# Patient Record
Sex: Male | Born: 1980 | Race: White | Hispanic: No | Marital: Married | Smoking: Former smoker
Health system: Southern US, Community
[De-identification: ages and names within clinical notes are randomized; demographics above are authoritative.]

## PROBLEM LIST (undated history)

## (undated) ENCOUNTER — Ambulatory Visit: Admission: EM | Discharge status: Against Medical Advice | Payer: 59

## (undated) DIAGNOSIS — M5416 Radiculopathy, lumbar region: Secondary | ICD-10-CM

## (undated) DIAGNOSIS — R55 Syncope and collapse: Secondary | ICD-10-CM

## (undated) HISTORY — PX: COLONOSCOPY: SHX174

---

## 2004-06-25 ENCOUNTER — Emergency Department: Payer: Self-pay | Admitting: Emergency Medicine

## 2005-05-12 HISTORY — PX: COLONOSCOPY: SHX174

## 2005-05-12 HISTORY — PX: UPPER GI ENDOSCOPY: SHX6162

## 2006-02-23 ENCOUNTER — Emergency Department: Payer: Self-pay | Admitting: Emergency Medicine

## 2006-08-20 ENCOUNTER — Other Ambulatory Visit: Payer: Self-pay

## 2006-08-20 ENCOUNTER — Emergency Department: Payer: Self-pay | Admitting: Emergency Medicine

## 2008-01-09 ENCOUNTER — Emergency Department: Payer: Self-pay | Admitting: Emergency Medicine

## 2008-10-17 ENCOUNTER — Inpatient Hospital Stay: Payer: Self-pay | Admitting: Internal Medicine

## 2009-05-12 DIAGNOSIS — K2970 Gastritis, unspecified, without bleeding: Secondary | ICD-10-CM

## 2009-05-12 HISTORY — DX: Gastritis, unspecified, without bleeding: K29.70

## 2009-12-04 ENCOUNTER — Ambulatory Visit: Payer: Self-pay | Admitting: Gastroenterology

## 2010-03-01 ENCOUNTER — Ambulatory Visit: Payer: Self-pay | Admitting: General Practice

## 2011-03-06 ENCOUNTER — Ambulatory Visit: Payer: Self-pay | Admitting: General Practice

## 2011-04-27 ENCOUNTER — Emergency Department: Payer: Self-pay | Admitting: General Practice

## 2012-05-14 ENCOUNTER — Ambulatory Visit: Payer: Self-pay | Admitting: Family Medicine

## 2012-05-17 ENCOUNTER — Ambulatory Visit: Payer: Self-pay | Admitting: General Practice

## 2012-09-06 ENCOUNTER — Emergency Department: Payer: Self-pay | Admitting: Emergency Medicine

## 2015-08-09 ENCOUNTER — Ambulatory Visit
Admission: RE | Admit: 2015-08-09 | Discharge: 2015-08-09 | Disposition: A | Discharge status: Home / Self Care | Payer: Worker's Compensation | Source: Ambulatory Visit | Attending: Family Medicine | Admitting: Family Medicine

## 2015-08-09 ENCOUNTER — Other Ambulatory Visit: Payer: Self-pay | Admitting: Family Medicine

## 2015-08-09 DIAGNOSIS — M47896 Other spondylosis, lumbar region: Secondary | ICD-10-CM | POA: Insufficient documentation

## 2015-08-09 DIAGNOSIS — M4802 Spinal stenosis, cervical region: Secondary | ICD-10-CM | POA: Diagnosis not present

## 2015-08-09 DIAGNOSIS — M542 Cervicalgia: Secondary | ICD-10-CM

## 2015-08-16 ENCOUNTER — Encounter: Payer: Self-pay | Admitting: General Surgery

## 2015-08-21 ENCOUNTER — Encounter: Payer: Self-pay | Admitting: General Surgery

## 2015-08-21 ENCOUNTER — Ambulatory Visit (INDEPENDENT_AMBULATORY_CARE_PROVIDER_SITE_OTHER): Payer: BLUE CROSS/BLUE SHIELD | Admitting: General Surgery

## 2015-08-21 VITALS — BP 138/72 | HR 74 | Resp 12 | Ht 74.0 in | Wt 243.0 lb

## 2015-08-21 DIAGNOSIS — M795 Residual foreign body in soft tissue: Secondary | ICD-10-CM | POA: Diagnosis not present

## 2015-08-21 NOTE — Patient Instructions (Signed)
Call with any questions or concerns. Review options and let us know.
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
## 2015-08-27 ENCOUNTER — Other Ambulatory Visit (HOSPITAL_COMMUNITY): Payer: Self-pay | Admitting: Family Medicine

## 2015-08-28 ENCOUNTER — Other Ambulatory Visit: Payer: Self-pay | Admitting: Family Medicine

## 2015-08-28 DIAGNOSIS — M5410 Radiculopathy, site unspecified: Secondary | ICD-10-CM

## 2015-08-28 DIAGNOSIS — M542 Cervicalgia: Secondary | ICD-10-CM

## 2015-09-14 ENCOUNTER — Ambulatory Visit
Admission: RE | Admit: 2015-09-14 | Discharge: 2015-09-14 | Disposition: A | Discharge status: Home / Self Care | Payer: Worker's Compensation | Source: Ambulatory Visit | Attending: Family Medicine | Admitting: Family Medicine

## 2015-09-14 DIAGNOSIS — M50222 Other cervical disc displacement at C5-C6 level: Secondary | ICD-10-CM | POA: Insufficient documentation

## 2015-09-14 DIAGNOSIS — M4802 Spinal stenosis, cervical region: Secondary | ICD-10-CM | POA: Diagnosis not present

## 2015-09-14 DIAGNOSIS — M47816 Spondylosis without myelopathy or radiculopathy, lumbar region: Secondary | ICD-10-CM | POA: Insufficient documentation

## 2015-09-14 DIAGNOSIS — M542 Cervicalgia: Secondary | ICD-10-CM | POA: Diagnosis present

## 2015-09-14 DIAGNOSIS — M778 Other enthesopathies, not elsewhere classified: Secondary | ICD-10-CM | POA: Diagnosis not present

## 2015-09-14 DIAGNOSIS — M5410 Radiculopathy, site unspecified: Secondary | ICD-10-CM

## 2015-09-14 HISTORY — DX: Syncope and collapse: R55

## 2015-09-14 MED ORDER — IOPAMIDOL (ISOVUE-M 300) INJECTION 61%: 15.0000 mL | Freq: Once | INTRAMUSCULAR | Status: DC | PRN

## 2015-09-14 NOTE — Progress Notes (Signed)
Patient ID: Garrett Mcpherson, male   DOB: [birth year], 35 y.o.   MRN: [id number] Cx myelogram completed.No complication. Pt stable.VSS,puncture site stable.D/C instructions given.F/U with his M.D.

## 2015-09-14 NOTE — Progress Notes (Signed)
Seen by Dr. Rosalene Billingsegister.  Patient cleared to be d/c home at 1330.

## 2016-10-05 IMAGING — CR DG MYELOGRAM CERVICAL
2 series · 11 of 11 positions shown · non-contrast
Comparison: none

CLINICAL DATA: Neck pain.
TECHNIQUE: Contiguous axial images were obtained through the Cervical spine
after the intrathecal infusion of infusion. Coronal and sagittal
reconstructions were obtained of the axial image sets.

[Series 1: cp_standard · 0.17mm/px · 10 of 10 slices shown]
[im 1/10]
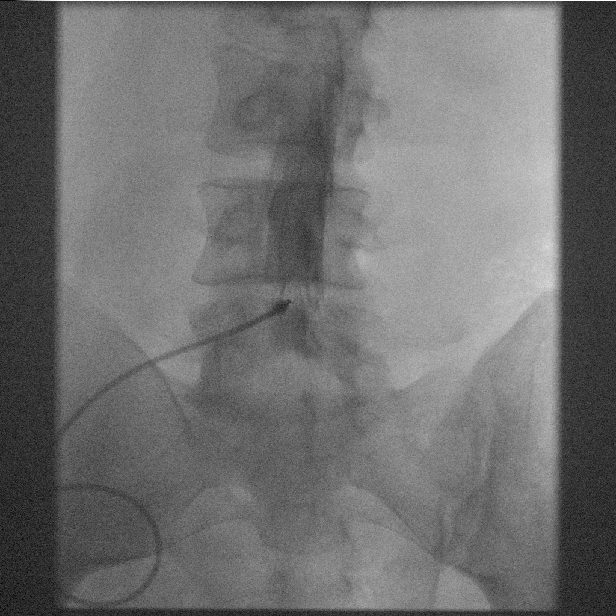
[im 2/10]
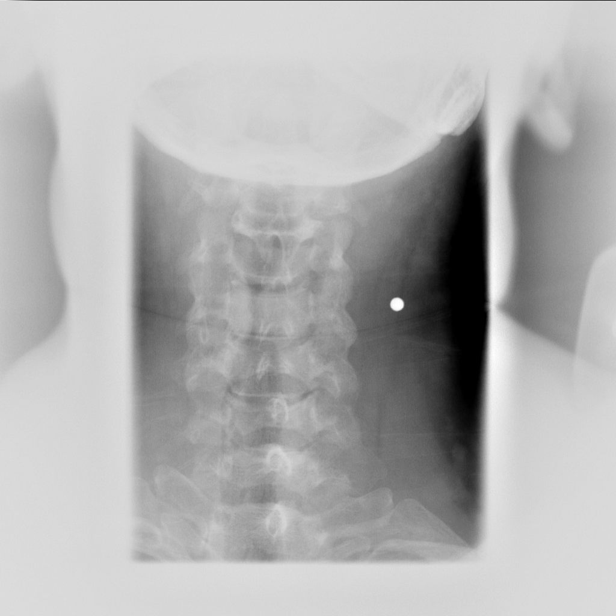
[im 3/10]
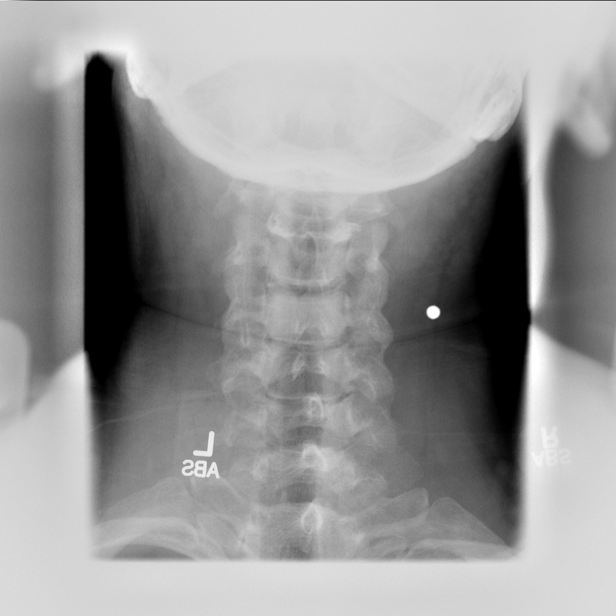
[im 4/10]
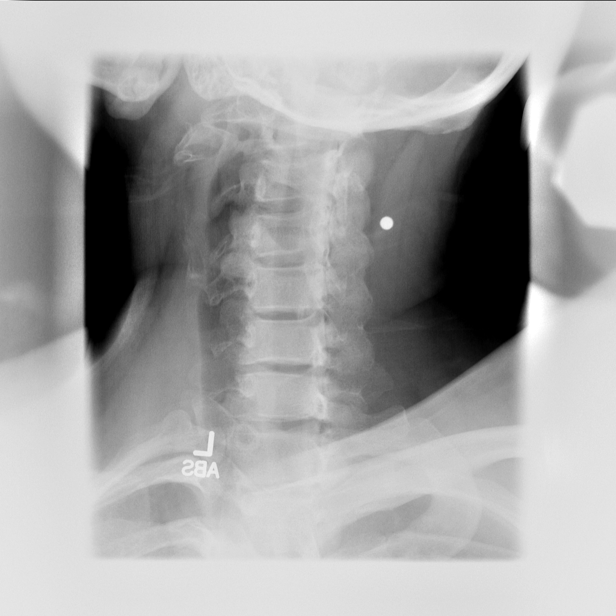
[im 5/10]
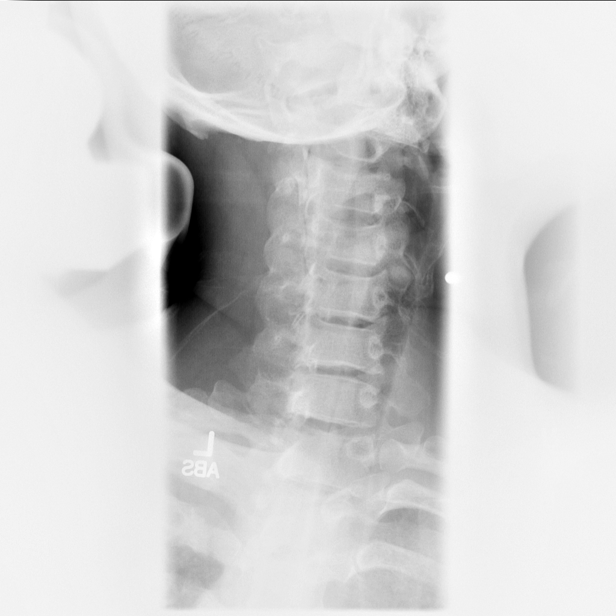
[im 6/10]
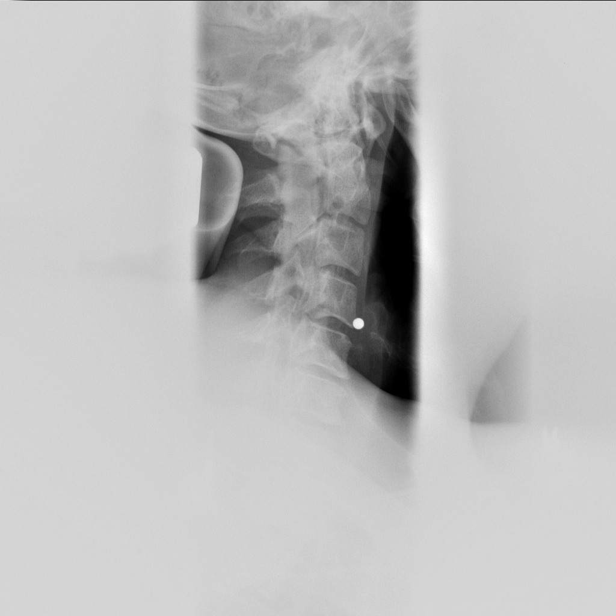
[im 7/10]
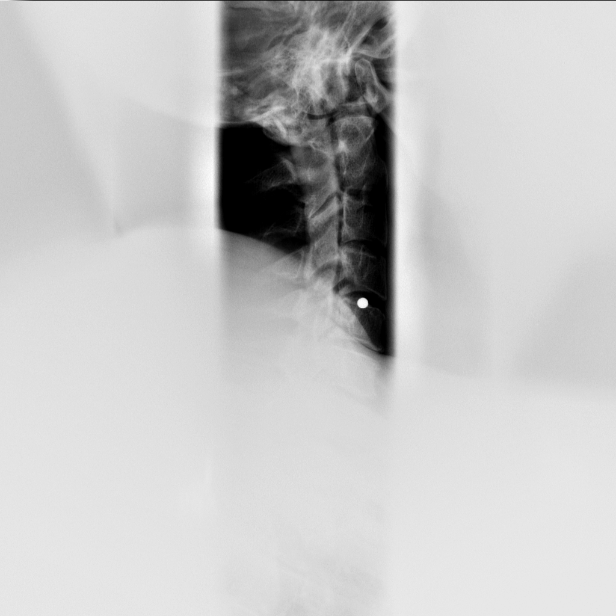
[im 8/10]
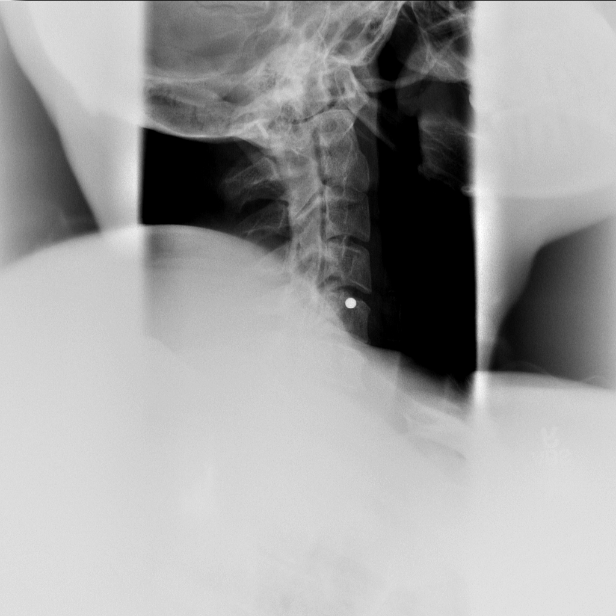
[im 9/10]
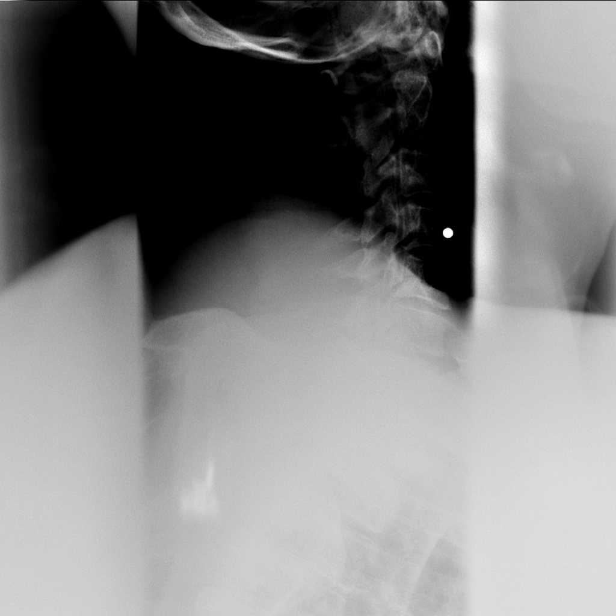
[im 10/10]
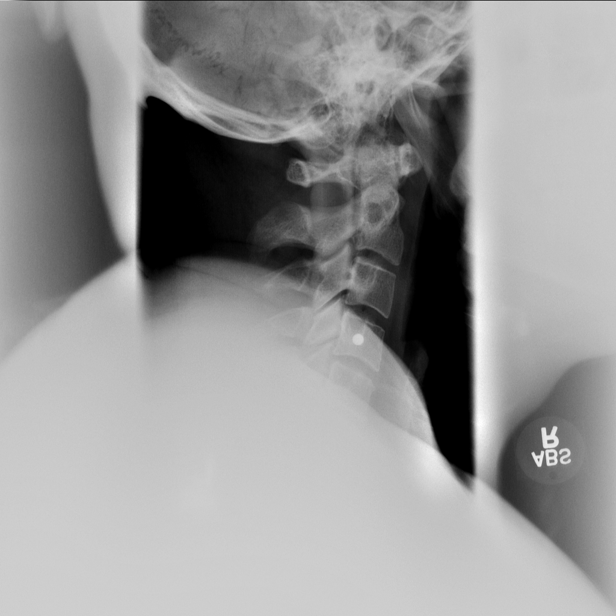

[x cervical spine lat]
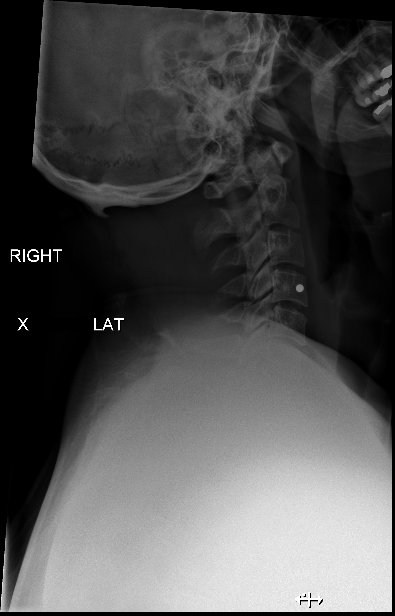

[11 of 11 positions shown; findings below may reference images not displayed]

FLUOROSCOPY TIME:  Fluoroscopic time 6 minutes 24 seconds. Eleven
images obtained .

PROCEDURE:
LUMBAR PUNCTURE FOR CERVICAL MYELOGRAM

After thorough discussion of risks and benefits of the procedure
including bleeding, infection, injury to nerves, blood vessels,
adjacent structures as well as headache and CSF leak, written and
oral informed consent was obtained. Consent was obtained by Dr.
Deeqa Rayaan Adlaho. We discussed the high likelihood of obtaining a
diagnostic study.

Patient was positioned prone on the fluoroscopy table. Local
anesthesia was provided with 1% lidocaine without epinephrine after
prepped and draped in the usual sterile fashion. Puncture was
performed at L4-L5 using a 3 1/2 inch 22-gauge spinal needle via
right _Paramedian) approach. Using a single pass through the dura,
the needle was placed within the thecal sac, with return of clear
CSF. 10 mL of Isovue-M -300 was injected into the thecal sac, with
normal opacification of the nerve roots and cauda equina consistent
with free flow within the subarachnoid space. The patient was then
moved to the trendelenburg position and contrast flowed into the
Cervical spine region.

I personally performed the lumbar puncture and administered the
intrathecal contrast. I also personally supervised acquisition of
the myelogram images.
FINDINGS: CERVICAL MYELOGRAM FINDINGS:

Contrast is noted in the cervical canal. No high-grade cervical
stenosis noted. Mild degenerative changes lower lumbar spine.
Metallic foreign body noted over the right neck.
IMPRESSION: Successful cervical myelogram. No complications. Reference is made
to postmyelogram CT for further discussion of findings.

## 2017-05-12 DIAGNOSIS — K37 Unspecified appendicitis: Secondary | ICD-10-CM

## 2017-05-12 HISTORY — DX: Unspecified appendicitis: K37

## 2017-12-20 ENCOUNTER — Encounter
Admission: EM | Disposition: A | Discharge status: Home / Self Care | Payer: Self-pay | Source: Home / Self Care | Attending: Emergency Medicine

## 2017-12-20 ENCOUNTER — Encounter: Payer: Self-pay | Admitting: Emergency Medicine

## 2017-12-20 ENCOUNTER — Other Ambulatory Visit: Payer: Self-pay

## 2017-12-20 ENCOUNTER — Observation Stay
Admission: EM | Admit: 2017-12-20 | Discharge: 2017-12-22 | Disposition: A | Discharge status: Home / Self Care | Payer: BLUE CROSS/BLUE SHIELD | Attending: Surgery | Admitting: Surgery

## 2017-12-20 ENCOUNTER — Observation Stay: Payer: BLUE CROSS/BLUE SHIELD | Admitting: Anesthesiology

## 2017-12-20 ENCOUNTER — Emergency Department: Payer: BLUE CROSS/BLUE SHIELD

## 2017-12-20 DIAGNOSIS — K3580 Unspecified acute appendicitis: Secondary | ICD-10-CM | POA: Diagnosis present

## 2017-12-20 DIAGNOSIS — Z87891 Personal history of nicotine dependence: Secondary | ICD-10-CM | POA: Diagnosis not present

## 2017-12-20 DIAGNOSIS — K3533 Acute appendicitis with perforation and localized peritonitis, with abscess: Principal | ICD-10-CM | POA: Insufficient documentation

## 2017-12-20 DIAGNOSIS — N289 Disorder of kidney and ureter, unspecified: Secondary | ICD-10-CM | POA: Insufficient documentation

## 2017-12-20 HISTORY — PX: LAPAROSCOPIC APPENDECTOMY: SHX408

## 2017-12-20 LAB — URINALYSIS, COMPLETE (UACMP) WITH MICROSCOPIC
BACTERIA UA: NONE SEEN
Bilirubin Urine: NEGATIVE
Glucose, UA: NEGATIVE mg/dL
Hgb urine dipstick: NEGATIVE
Ketones, ur: NEGATIVE mg/dL
Leukocytes, UA: NEGATIVE
Nitrite: NEGATIVE
PROTEIN: 30 mg/dL — AB
SPECIFIC GRAVITY, URINE: 1.024 (ref 1.005–1.030)
Squamous Epithelial / LPF: NONE SEEN (ref 0–5)
pH: 7 (ref 5.0–8.0)

## 2017-12-20 LAB — COMPREHENSIVE METABOLIC PANEL
ALBUMIN: 4.2 g/dL (ref 3.5–5.0)
ALK PHOS: 63 U/L (ref 38–126)
ALT: 25 U/L (ref 0–44)
ANION GAP: 8 (ref 5–15)
AST: 22 U/L (ref 15–41)
BUN: 15 mg/dL (ref 6–20)
CHLORIDE: 101 mmol/L (ref 98–111)
CO2: 30 mmol/L (ref 22–32)
Calcium: 8.7 mg/dL — ABNORMAL LOW (ref 8.9–10.3)
Creatinine, Ser: 1.26 mg/dL — ABNORMAL HIGH (ref 0.61–1.24)
GFR calc Af Amer: >60 mL/min (ref >60)
GFR calc non Af Amer: >60 mL/min (ref >60)
GLUCOSE: 113 mg/dL — AB (ref 70–99)
Potassium: 4 mmol/L (ref 3.5–5.1)
SODIUM: 139 mmol/L (ref 135–145)
Total Bilirubin: 1.5 mg/dL — ABNORMAL HIGH (ref 0.3–1.2)
Total Protein: 7.2 g/dL (ref 6.5–8.1)

## 2017-12-20 LAB — CBC WITH DIFFERENTIAL/PLATELET
Basophils Absolute: 0.1 10*3/uL (ref 0–0.1)
Basophils Relative: 1 %
EOS PCT: 0 %
Eosinophils Absolute: 0 10*3/uL (ref 0–0.7)
HCT: 48.1 % (ref 40.0–52.0)
Hemoglobin: 16.3 g/dL (ref 13.0–18.0)
LYMPHS ABS: 1.8 10*3/uL (ref 1.0–3.6)
Lymphocytes Relative: 10 %
MCH: 29.4 pg (ref 26.0–34.0)
MCHC: 33.9 g/dL (ref 32.0–36.0)
MCV: 86.9 fL (ref 80.0–100.0)
MONO ABS: 1.7 10*3/uL — AB (ref 0.2–1.0)
MONOS PCT: 10 %
Neutro Abs: 14.2 10*3/uL — ABNORMAL HIGH (ref 1.4–6.5)
Neutrophils Relative %: 79 %
PLATELETS: 173 10*3/uL (ref 150–440)
RBC: 5.53 MIL/uL (ref 4.40–5.90)
RDW: 14.3 % (ref 11.5–14.5)
WBC: 17.9 10*3/uL — ABNORMAL HIGH (ref 3.8–10.6)

## 2017-12-20 LAB — LIPASE, BLOOD: Lipase: 21 U/L (ref 11–51)

## 2017-12-20 LAB — MRSA PCR SCREENING: MRSA by PCR: NEGATIVE

## 2017-12-20 SURGERY — APPENDECTOMY, LAPAROSCOPIC
Anesthesia: General | Wound class: Contaminated

## 2017-12-20 MED ORDER — LIDOCAINE HCL (PF) 1 % IJ SOLN
INTRAMUSCULAR | Status: AC
  Filled 2017-12-20: qty 30

## 2017-12-20 MED ORDER — ROCURONIUM BROMIDE 100 MG/10ML IV SOLN
INTRAVENOUS | Status: DC | PRN
  Administered 2017-12-20: 40 mg via INTRAVENOUS
  Administered 2017-12-20: 10 mg via INTRAVENOUS

## 2017-12-20 MED ORDER — ONDANSETRON 4 MG PO TBDP: 4.0000 mg | ORAL_TABLET | Freq: Four times a day (QID) | ORAL | Status: DC | PRN

## 2017-12-20 MED ORDER — ONDANSETRON HCL 4 MG/2ML IJ SOLN
4.0000 mg | Freq: Four times a day (QID) | INTRAMUSCULAR | Status: DC | PRN
  Administered 2017-12-20: 4 mg via INTRAVENOUS

## 2017-12-20 MED ORDER — LACTATED RINGERS IV SOLN
INTRAVENOUS | Status: DC
  Administered 2017-12-20 – 2017-12-21 (×3): via INTRAVENOUS

## 2017-12-20 MED ORDER — LACTATED RINGERS IV SOLN
INTRAVENOUS | Status: DC
  Administered 2017-12-20 (×2): via INTRAVENOUS

## 2017-12-20 MED ORDER — BUPIVACAINE-EPINEPHRINE 0.5% -1:200000 IJ SOLN
INTRAMUSCULAR | Status: DC | PRN
  Administered 2017-12-20: 7 mL

## 2017-12-20 MED ORDER — SUGAMMADEX SODIUM 500 MG/5ML IV SOLN
INTRAVENOUS | Status: DC | PRN
  Administered 2017-12-20: 217.8 mg via INTRAVENOUS

## 2017-12-20 MED ORDER — FENTANYL CITRATE (PF) 100 MCG/2ML IJ SOLN
INTRAMUSCULAR | Status: DC | PRN
  Administered 2017-12-20: 50 ug via INTRAVENOUS
  Administered 2017-12-20 (×2): 25 ug via INTRAVENOUS
  Administered 2017-12-20 (×2): 50 ug via INTRAVENOUS

## 2017-12-20 MED ORDER — ONDANSETRON HCL 4 MG/2ML IJ SOLN
4.0000 mg | Freq: Once | INTRAMUSCULAR | Status: AC
  Administered 2017-12-20: 4 mg via INTRAVENOUS
  Filled 2017-12-20: qty 2

## 2017-12-20 MED ORDER — MIDAZOLAM HCL 2 MG/2ML IJ SOLN
INTRAMUSCULAR | Status: DC | PRN
Start: 2017-12-20 — End: 2017-12-20
  Administered 2017-12-20: 2 mg via INTRAVENOUS

## 2017-12-20 MED ORDER — HYDROMORPHONE HCL 1 MG/ML IJ SOLN
0.5000 mg | INTRAMUSCULAR | Status: DC | PRN
  Administered 2017-12-20 (×3): 0.5 mg via INTRAVENOUS
  Filled 2017-12-20: qty 1
  Filled 2017-12-20 (×2): qty 0.5

## 2017-12-20 MED ORDER — PROPOFOL 10 MG/ML IV BOLUS
INTRAVENOUS | Status: DC | PRN
  Administered 2017-12-20: 150 mg via INTRAVENOUS

## 2017-12-20 MED ORDER — MORPHINE SULFATE (PF) 4 MG/ML IV SOLN
4.0000 mg | Freq: Once | INTRAVENOUS | Status: AC
  Administered 2017-12-20: 4 mg via INTRAVENOUS
  Filled 2017-12-20: qty 1

## 2017-12-20 MED ORDER — MORPHINE SULFATE (PF) 2 MG/ML IV SOLN
2.0000 mg | INTRAVENOUS | Status: DC | PRN
  Administered 2017-12-20: 2 mg via INTRAVENOUS
  Filled 2017-12-20: qty 1

## 2017-12-20 MED ORDER — MIDAZOLAM HCL 2 MG/2ML IJ SOLN
INTRAMUSCULAR | Status: AC
  Filled 2017-12-20: qty 2

## 2017-12-20 MED ORDER — ENOXAPARIN SODIUM 40 MG/0.4ML ~~LOC~~ SOLN
40.0000 mg | SUBCUTANEOUS | Status: DC
  Administered 2017-12-21 – 2017-12-22 (×2): 40 mg via SUBCUTANEOUS
  Filled 2017-12-20 (×2): qty 0.4

## 2017-12-20 MED ORDER — ONDANSETRON HCL 4 MG/2ML IJ SOLN: 4.0000 mg | Freq: Once | INTRAMUSCULAR | Status: DC | PRN

## 2017-12-20 MED ORDER — LIDOCAINE HCL (PF) 1 % IJ SOLN
INTRAMUSCULAR | Status: DC | PRN
  Administered 2017-12-20: 7 mL via SUBCUTANEOUS

## 2017-12-20 MED ORDER — ROCURONIUM BROMIDE 100 MG/10ML IV SOLN
INTRAVENOUS | Status: AC
  Filled 2017-12-20: qty 1

## 2017-12-20 MED ORDER — TRAMADOL HCL 50 MG PO TABS: 50.0000 mg | ORAL_TABLET | Freq: Four times a day (QID) | ORAL | Status: DC | PRN

## 2017-12-20 MED ORDER — KETOROLAC TROMETHAMINE 30 MG/ML IJ SOLN
INTRAMUSCULAR | Status: DC | PRN
  Administered 2017-12-20: 30 mg via INTRAVENOUS

## 2017-12-20 MED ORDER — PIPERACILLIN-TAZOBACTAM 3.375 G IVPB
3.3750 g | Freq: Three times a day (TID) | INTRAVENOUS | Status: DC
  Administered 2017-12-20 – 2017-12-22 (×5): 3.375 g via INTRAVENOUS
  Filled 2017-12-20 (×5): qty 50

## 2017-12-20 MED ORDER — HYDROCODONE-ACETAMINOPHEN 5-325 MG PO TABS: 1.0000 | ORAL_TABLET | ORAL | Status: DC | PRN

## 2017-12-20 MED ORDER — ACETAMINOPHEN 10 MG/ML IV SOLN
INTRAVENOUS | Status: DC | PRN
  Administered 2017-12-20: 1000 mg via INTRAVENOUS

## 2017-12-20 MED ORDER — DEXAMETHASONE SODIUM PHOSPHATE 10 MG/ML IJ SOLN
INTRAMUSCULAR | Status: DC | PRN
  Administered 2017-12-20: 10 mg via INTRAVENOUS

## 2017-12-20 MED ORDER — BUPIVACAINE-EPINEPHRINE (PF) 0.5% -1:200000 IJ SOLN
INTRAMUSCULAR | Status: AC
  Filled 2017-12-20: qty 30

## 2017-12-20 MED ORDER — SUCCINYLCHOLINE CHLORIDE 20 MG/ML IJ SOLN
INTRAMUSCULAR | Status: DC | PRN
  Administered 2017-12-20: 100 mg via INTRAVENOUS

## 2017-12-20 MED ORDER — SEVOFLURANE IN SOLN
RESPIRATORY_TRACT | Status: AC
  Filled 2017-12-20: qty 250

## 2017-12-20 MED ORDER — FENTANYL CITRATE (PF) 100 MCG/2ML IJ SOLN: 25.0000 ug | INTRAMUSCULAR | Status: DC | PRN

## 2017-12-20 MED ORDER — LIDOCAINE HCL (CARDIAC) PF 100 MG/5ML IV SOSY
PREFILLED_SYRINGE | INTRAVENOUS | Status: DC | PRN
  Administered 2017-12-20: 100 mg via INTRAVENOUS

## 2017-12-20 MED ORDER — FENTANYL CITRATE (PF) 250 MCG/5ML IJ SOLN
INTRAMUSCULAR | Status: AC
  Filled 2017-12-20: qty 5

## 2017-12-20 MED ORDER — IOHEXOL 300 MG/ML  SOLN
100.0000 mL | Freq: Once | INTRAMUSCULAR | Status: AC | PRN
  Administered 2017-12-20: 100 mL via INTRAVENOUS

## 2017-12-20 MED ORDER — DOCUSATE SODIUM 100 MG PO CAPS
100.0000 mg | ORAL_CAPSULE | Freq: Two times a day (BID) | ORAL | Status: DC | PRN
Start: 2017-12-20 — End: 2017-12-22

## 2017-12-20 MED ORDER — ACETAMINOPHEN 10 MG/ML IV SOLN
INTRAVENOUS | Status: AC
  Filled 2017-12-20: qty 100

## 2017-12-20 MED ORDER — LIDOCAINE HCL (PF) 2 % IJ SOLN
INTRAMUSCULAR | Status: AC
  Filled 2017-12-20: qty 10

## 2017-12-20 MED ORDER — IOPAMIDOL (ISOVUE-300) INJECTION 61%
30.0000 mL | Freq: Once | INTRAVENOUS | Status: AC | PRN
  Administered 2017-12-20: 30 mL via ORAL

## 2017-12-20 MED ORDER — PROPOFOL 10 MG/ML IV BOLUS
INTRAVENOUS | Status: AC
  Filled 2017-12-20: qty 20

## 2017-12-20 SURGICAL SUPPLY — 47 items
5mm Laparoscopic Kittner ×3 IMPLANT
BLADE SURG 15 STRL LF DISP TIS (BLADE) IMPLANT
BLADE SURG 15 STRL SS (BLADE)
BLADE SURG SZ11 CARB STEEL (BLADE) IMPLANT
CANISTER SUCT 1200ML W/VALVE (MISCELLANEOUS) ×3 IMPLANT
CANNULA DILATOR 10 W/SLV (CANNULA) ×2 IMPLANT
CANNULA DILATOR 10MM W/SLV (CANNULA) ×1
CUTTER FLEX LINEAR 45M (STAPLE) ×3 IMPLANT
DECANTER SPIKE VIAL GLASS SM (MISCELLANEOUS) ×6 IMPLANT
DERMABOND ADVANCED (GAUZE/BANDAGES/DRESSINGS) ×2
DERMABOND ADVANCED .7 DNX12 (GAUZE/BANDAGES/DRESSINGS) ×1 IMPLANT
DISSECTOR BLUNT CHERRY 10MM (MISCELLANEOUS) ×3 IMPLANT
DRSG TEGADERM 2-3/8X2-3/4 SM (GAUZE/BANDAGES/DRESSINGS) ×3 IMPLANT
DRSG TELFA 4X3 1S NADH ST (GAUZE/BANDAGES/DRESSINGS) ×3 IMPLANT
ELECT REM PT RETURN 9FT ADLT (ELECTROSURGICAL) ×3
ELECTRODE REM PT RTRN 9FT ADLT (ELECTROSURGICAL) ×1 IMPLANT
GLOVE BIOGEL PI IND STRL 7.0 (GLOVE) ×1 IMPLANT
GLOVE BIOGEL PI INDICATOR 7.0 (GLOVE) ×2
GLOVE SURG SYN 6.5 ES PF (GLOVE) ×18 IMPLANT
GOWN STRL REUS W/ TWL LRG LVL3 (GOWN DISPOSABLE) ×3 IMPLANT
GOWN STRL REUS W/TWL LRG LVL3 (GOWN DISPOSABLE) ×6
GRASPER SUT TROCAR 14GX15 (MISCELLANEOUS) ×3 IMPLANT
HANDLE YANKAUER SUCT BULB TIP (MISCELLANEOUS) IMPLANT
IRRIGATION STRYKERFLOW (MISCELLANEOUS) ×1 IMPLANT
IRRIGATOR STRYKERFLOW (MISCELLANEOUS) ×3
IV NS 1000ML (IV SOLUTION) ×2
IV NS 1000ML BAXH (IV SOLUTION) ×1 IMPLANT
KIT TURNOVER KIT A (KITS) ×3 IMPLANT
LIGASURE LAP MARYLAND 5MM 37CM (ELECTROSURGICAL) ×3 IMPLANT
NEEDLE HYPO 22GX1.5 SAFETY (NEEDLE) ×6 IMPLANT
NEEDLE VERESS 14GA 120MM (NEEDLE) ×3 IMPLANT
NS IRRIG 500ML POUR BTL (IV SOLUTION) ×3 IMPLANT
PACK LAP CHOLECYSTECTOMY (MISCELLANEOUS) ×3 IMPLANT
PENCIL ELECTRO HAND CTR (MISCELLANEOUS) ×3 IMPLANT
POUCH ENDO CATCH 10MM SPEC (MISCELLANEOUS) ×3 IMPLANT
RELOAD 45 VASCULAR/THIN (ENDOMECHANICALS) ×3 IMPLANT
RELOAD STAPLE TA45 3.5 REG BLU (ENDOMECHANICALS) ×6 IMPLANT
SCISSORS METZENBAUM CVD 33 (INSTRUMENTS) IMPLANT
SUT MNCRL AB 4-0 PS2 18 (SUTURE) ×3 IMPLANT
SUT VIC AB 3-0 SH 27 (SUTURE) ×2
SUT VIC AB 3-0 SH 27X BRD (SUTURE) ×1 IMPLANT
SUT VICRYL PLUS ABS 0 54 (SUTURE) ×3 IMPLANT
SUT VICRYL+ 3-0 36IN CT-1 (SUTURE) ×3 IMPLANT
TRAY FOLEY MTR SLVR 16FR STAT (SET/KITS/TRAYS/PACK) ×3 IMPLANT
TROCAR XCEL 12X100 BLDLESS (ENDOMECHANICALS) ×3 IMPLANT
TROCAR XCEL NON-BLD 5MMX100MML (ENDOMECHANICALS) ×6 IMPLANT
TUBING INSUFFLATION (TUBING) ×3 IMPLANT

## 2017-12-20 NOTE — ED Provider Notes (Signed)
[facility] Emergency Department Provider Note   ____________________________________________   First MD Initiated Contact with Patient 12/20/17 [phone]     (approximate)  I have reviewed the triage vital signs and the nursing notes.   HISTORY  Chief Complaint Abdominal Pain    HPI Garrett Mcpherson is a 37 y.o. male patient with right lower quadrant abdominal pain since Friday with some nausea and vomiting no diarrhea he has had a fever at home.  Pain is getting worse.  Is made worse by the bumps in the road or palpation and percussion.  Otherwise he is doing well and in good health.  He has no chest pain or other pain.  Pain is moderate in severity achy   Past Medical History:  Diagnosis Date  . 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 Foreign body (FB) in soft tissue 08/21/2015    Past Surgical History:  Procedure Laterality Date  . COLONOSCOPY  2007  . UPPER GI ENDOSCOPY  2007    Prior to Admission medications   Medication Sig Start Date End Date Taking? Authorizing Provider  Multiple Vitamin (MULTIVITAMIN) capsule Take 1 capsule by mouth daily.    [provider]    Allergies Patient has no known allergies.  Family History  Problem Relation Age of Onset  . Cancer Mother        breast     Social History Social History   Tobacco Use  . Smoking status: Former Smoker    Years: 10.00    Last attempt to quit: 09/14/2003    Years since quitting: 14.2  . Smokeless tobacco: Never Used  Substance Use Topics  . Alcohol use: Yes    Alcohol/week: 0.0 standard drinks    Comment: very rarely  . Drug use: No    Review of Systems  Constitutional: No fever/chills Eyes: No visual changes. ENT: No sore throat. Cardiovascular: Denies chest pain. Respiratory: Denies shortness of breath. Gastrointestinal: See HPI Genitourinary: Negative for dysuria. Musculoskeletal:  Negative for back pain. Skin: Negative for rash. Neurological: Negative for headaches, focal weakness   ____________________________________________   PHYSICAL EXAM:  VITAL SIGNS: ED Triage Vitals  Enc Vitals Group     BP 12/20/17 0815 123/80     Pulse Rate 12/20/17 0815 (!) 108     Resp 12/20/17 0815 18     Temp 12/20/17 0815 98.4 F (36.9 C)     Temp Source 12/20/17 0815 Oral     SpO2 12/20/17 0815 99 %     Weight 12/20/17 0813 240 lb (108.9 kg)     Height 12/20/17 0813 6\' 2"  (1.88 m)     Head Circumference --      Peak Flow --      Pain Score 12/20/17 0813 6     Pain Loc --      Pain Edu? --      Excl. in GC? --     Constitutional: Alert and oriented. Well appearing and in no acute distress. Eyes: Conjunctivae are normal.  Head: Atraumatic. Nose: No congestion/rhinnorhea. Mouth/Throat: Mucous membranes are moist.  Oropharynx non-erythematous. Neck: No stridor. Cardiovascular: Normal rate, regular rhythm. Grossly normal heart sounds.  Good peripheral circulation. Respiratory: Normal respiratory effort.  No retractions. Lungs CTAB. Gastrointestinal: Soft and nontender except for in the right lower quadrant where it is tender to palpation percussion. No distention. No abdominal bruits. No CVA  tenderness. Musculoskeletal: No lower extremity tenderness nor edema.  No joint effusions. Neurologic:  Normal speech and language. No gross focal neurologic deficits are appreciated. No gait instability. Skin:  Skin is warm, dry and intact. No rash noted. Psychiatric: Mood and affect are normal. Speech and behavior are normal.  ____________________________________________   LABS (all labs ordered are listed, but only abnormal results are displayed)  Labs Reviewed  CBC WITH DIFFERENTIAL/PLATELET - Abnormal; Notable for the following components:      Result Value   WBC 17.9 (*)    Neutro Abs 14.2 (*)    Monocytes Absolute 1.7 (*)    All other components within normal limits    URINALYSIS, COMPLETE (UACMP) WITH MICROSCOPIC - Abnormal; Notable for the following components:   Color, Urine YELLOW (*)    APPearance HAZY (*)    Protein, ur 30 (*)    All other components within normal limits  COMPREHENSIVE METABOLIC PANEL - Abnormal; Notable for the following components:   Glucose, Bld 113 (*)    Creatinine, Ser 1.26 (*)    Calcium 8.7 (*)    Total Bilirubin 1.5 (*)    All other components within normal limits  LIPASE, BLOOD   ____________________________________________  EKG  EKG read interpreted by me shows normal sinus rhythm rate of 96 normal axis computer is reading ST elevation suggesting acute pericarditis patient has no chest pain not really seeing any excessive ST segment elevation ____________________________________________  RADIOLOGY  ED MD interpretation: CT read by radiology reviewed by me shows acute appendicitis  Official radiology report(s): Ct Abdomen Pelvis W Contrast  Result Date: 12/20/2017 CLINICAL DATA:  Right lower quadrant pain, nausea/vomiting, fever, leukocytosis EXAM: CT ABDOMEN AND PELVIS WITH CONTRAST TECHNIQUE: Multidetector CT imaging of the abdomen and pelvis was performed using the standard protocol following bolus administration of intravenous contrast. CONTRAST:  100mL OMNIPAQUE IOHEXOL 300 MG/ML  SOLN COMPARISON:  None. FINDINGS: Lower chest: Mild linear scarring/atelectasis at the lung bases. Hepatobiliary: Liver is within normal limits. Gallbladder is unremarkable. No intrahepatic or extrahepatic duct dilatation. Pancreas: Within normal limits. Spleen: Within normal limits. Adrenals/Urinary Tract: Adrenal glands are within normal limits. Kidneys are within normal limits.  No hydronephrosis. Bladder is within normal limits. Stomach/Bowel: Stomach is within normal limits. No evidence of bowel obstruction. Abnormal dilated appendix, measuring up to 16 mm (series 2/image 37), with associated mucosal enhancement and periappendiceal  stranding. No associated appendicolith. No drainable fluid collection/abscess.  No free air. Vascular/Lymphatic: No evidence of abdominal aortic aneurysm. No suspicious abdominopelvic lymphadenopathy. Reproductive: Prostate is unremarkable. Other: No abdominopelvic ascites. Musculoskeletal: Mild degenerative changes at L5-S1. IMPRESSION: Acute appendicitis. No drainable fluid collection/abscess.  No free air. Electronically Signed   By: Charline BillsSriyesh  Krishnan M.D.   On: 12/20/2017 10:43    ____________________________________________   PROCEDURES  Procedure(s) performed:   Procedures  Critical Care performed:   ____________________________________________   INITIAL IMPRESSION / ASSESSMENT AND PLAN / ED COURSE  Will call surgery and have him admitted.      ____________________________________________   FINAL CLINICAL IMPRESSION(S) / ED DIAGNOSES  Final diagnoses:  Acute appendicitis, unspecified acute appendicitis type     ED Discharge Orders    None       Note:  This document was prepared using Dragon voice recognition software and may include unintentional dictation errors.    Arnaldo NatalMalinda, Paul F, MD 12/20/17 1053

## 2017-12-20 NOTE — Transfer of Care (Signed)
Immediate Anesthesia Transfer of Care Note  Patient: Garrett SiasGraham L Braziel  Procedure(s) Performed: APPENDECTOMY LAPAROSCOPIC (N/A )  Patient Location: PACU  Anesthesia Type:General  Level of Consciousness: sedated  Airway & Oxygen Therapy: Patient Spontanous Breathing and Patient connected to face mask oxygen  Post-op Assessment: Report given to RN and Post -op Vital signs reviewed and stable  Post vital signs: Reviewed and stable  Last Vitals:  Vitals Value Taken Time  BP 127/79 12/20/2017  8:58 PM  Temp    Pulse 84 12/20/2017  9:01 PM  Resp 13 12/20/2017  9:01 PM  SpO2 100 % 12/20/2017  9:01 PM  Vitals shown include unvalidated device data.  Last Pain:  Vitals:   12/20/17 1608  TempSrc:   PainSc: 3       Patients Stated Pain Goal: 0 (12/20/17 1538)  Complications: No apparent anesthesia complications

## 2017-12-20 NOTE — H&P (Addendum)
H&P, incorrectly labeled  Subjective:   CC: acute appendicitis  HPI:  Garrett Mcpherson is a 37 y.o. male who is consulted by Dorothea Glassmanpaul malinda for evaluation of  above cc.  Symptoms were first noted 3 days ago. Pain is sharp, located in RLQ, radiating to LLQ.  Associated with nausea/anorexia, exacerbated by movement    Past Medical History:  has a past medical history of Syncope.  Past Surgical History:  has a past surgical history that includes Colonoscopy (2007) and Upper gi endoscopy (2007).  Family History: family history includes Cancer in his mother.  Social History:  reports that he quit smoking about 14 years ago. He quit after 10.00 years of use. He has never used smokeless tobacco. He reports that he drinks alcohol. He reports that he does not use drugs.  Current Medications: MVI  Allergies:  Allergies as of 12/20/2017  . (No Known Allergies)    ROS:  A 15 point review of systems was performed and pertinent positives and negatives noted in HPI    Objective:     BP 130/82 (BP Location: Left Arm)   Pulse 92   Temp 98.4 F (36.9 C) (Oral)   Resp 18   Ht 6\' 2"  (1.88 m)   Wt 108.9 kg   SpO2 95%   BMI 30.81 kg/m    Constitutional :  alert, cooperative, appears stated age and no distress  Lymphatics/Throat:  no asymmetry, masses, or scars  Respiratory:  clear to auscultation bilaterally  Cardiovascular:  regular rate and rhythm  Gastrointestinal: soft, all quadrant except for RLQ, with guarding and radiating tenderness to LLQ.  pain with movement as well.   Musculoskeletal: Steady gait and movement  Skin: Cool and moist,   Psychiatric: Normal affect, non-agitated, not confused       LABS:  CMP Latest Ref Rng & Units 12/20/2017  Glucose 70 - 99 mg/dL 811(B113(H)  BUN 6 - 20 mg/dL 15  Creatinine 1.470.61 - 8.291.24 mg/dL 5.62(Z1.26(H)  Sodium 308135 - 657145 mmol/L 139  Potassium 3.5 - 5.1 mmol/L 4.0  Chloride 98 - 111 mmol/L 101  CO2 22 - 32 mmol/L 30  Calcium 8.9 - 10.3 mg/dL  8.4(O8.7(L)  Total Protein 6.5 - 8.1 g/dL 7.2  Total Bilirubin 0.3 - 1.2 mg/dL 9.6(E1.5(H)  Alkaline Phos 38 - 126 U/L 63  AST 15 - 41 U/L 22  ALT 0 - 44 U/L 25   CBC Latest Ref Rng & Units 12/20/2017  WBC 3.8 - 10.6 K/uL 17.9(H)  Hemoglobin 13.0 - 18.0 g/dL 95.216.3  Hematocrit 84.140.0 - 52.0 % 48.1  Platelets 150 - 440 K/uL 173     RADS: CLINICAL DATA:  Right lower quadrant pain, nausea/vomiting, fever, leukocytosis  EXAM: CT ABDOMEN AND PELVIS WITH CONTRAST  TECHNIQUE: Multidetector CT imaging of the abdomen and pelvis was performed using the standard protocol following bolus administration of intravenous contrast.  CONTRAST:  100mL OMNIPAQUE IOHEXOL 300 MG/ML  SOLN  COMPARISON:  None.  FINDINGS: Lower chest: Mild linear scarring/atelectasis at the lung bases.  Hepatobiliary: Liver is within normal limits.  Gallbladder is unremarkable. No intrahepatic or extrahepatic duct dilatation.  Pancreas: Within normal limits.  Spleen: Within normal limits.  Adrenals/Urinary Tract: Adrenal glands are within normal limits.  Kidneys are within normal limits.  No hydronephrosis.  Bladder is within normal limits.  Stomach/Bowel: Stomach is within normal limits.  No evidence of bowel obstruction.  Abnormal dilated appendix, measuring up to 16 mm (series 2/image 37), with associated  mucosal enhancement and periappendiceal stranding. No associated appendicolith.  No drainable fluid collection/abscess.  No free air.  Vascular/Lymphatic: No evidence of abdominal aortic aneurysm.  No suspicious abdominopelvic lymphadenopathy.  Reproductive: Prostate is unremarkable.  Other: No abdominopelvic ascites.  Musculoskeletal: Mild degenerative changes at L5-S1.  IMPRESSION: Acute appendicitis.  No drainable fluid collection/abscess.  No free air.   Electronically Signed   By: Charline Bills M.D.   On: 12/20/2017 10:43 Assessment:      Acute  appendicitis, non-perforated Leukocytosis Acute renal insufficiency  Plan:      Discussed the risk of lap appy surgery including post-op infxn, seroma, hematoma, abscess formation, chronic pain, poor-delayed wound healing, possible bowel resection, possible ostomy, possible conversion to open procedure, post-op SBO or ileus, and need for additional procedures to address said risks.  The risks of general anesthetic including MI, CVA, sudden death or even reaction to anesthetic medications also discussed. Alternatives include continued observation, or antibiotic treatment.  Benefits include possible symptom relief,   Typical post operative recovery of 3-5 days rest, also discussed.  The patient understands the risks, any and all questions were answered to the patient's satisfaction. Will take to OR when time available.  To Floor for abx, IVF until then.  Leukocytosis- from appendicitis, continue to monitor while on abx  Acute renal insufficiency-reactive from above.  Will try fluid resuscitation  Admit: medsurg IVF: LR@100ml  Abx: zosyn DVT prophy: lovenox postop, SCDs GI prophy: pepcid Diet: NPO, sips with meds Pain control: prn I&O: qshift Activity: adlib Labs: daily am

## 2017-12-20 NOTE — Anesthesia Postprocedure Evaluation (Signed)
Anesthesia Post Note  Patient: Garrett Mcpherson  Procedure(Mcpherson) Performed: APPENDECTOMY LAPAROSCOPIC (N/A )  Patient location during evaluation: PACU Anesthesia Type: General Level of consciousness: awake and alert Pain management: pain level controlled Vital Signs Assessment: post-procedure vital signs reviewed and stable Respiratory status: spontaneous breathing, nonlabored ventilation, respiratory function stable and patient connected to nasal cannula oxygen Cardiovascular status: blood pressure returned to baseline and stable Postop Assessment: no apparent nausea or vomiting Anesthetic complications: no     Last Vitals:  Vitals:   12/20/17 2155 12/20/17 2229  BP: 118/75 117/71  Pulse: 83 80  Resp: 20 20  Temp: 37.2 C 36.4 C  SpO2: 93% 94%    Last Pain:  Vitals:   12/20/17 2229  TempSrc: Oral  PainSc:                  Garrett Mcpherson

## 2017-12-20 NOTE — ED Notes (Signed)
Report to Alease Fait, RN

## 2017-12-20 NOTE — Op Note (Signed)
Preoperative diagnosis: Acute appendicitis.  Postoperative diagnosis: Acute appendicitis  Procedure: Laparoscopic appendectomy.  Anesthesia: GETA  Surgeon: Sung AmabileIsami Zachary Nole  Wound Classification: clean contaminated  Specimen: Appendix  Complications: None  Estimated Blood Loss: 10 mL   Indications: Patient is a 37 y.o. male  presented with right lower quadrant pain.  Computed tomography scan and physical examination were consistent with acute appendicitis.   Findings: 1. Acutely inflamed appendix 2. Extensive phlegmon surrounding appendix, pealed 3. Normal anatomy 4. Appendiceal artery ligated and divided with EndoGIA 5. Adequate hemostasis.   Description of procedure: The patient was placed on the operating table in the supine position, left arm tucked. General anesthesia was induced. A time-out was completed verifying correct patient, procedure, site, positioning, and implant(s) and/or special equipment prior to beginning this procedure. A Foley catheter placed. The abdomen was prepped and draped in the usual sterile fashion.   After local was infused, an incision was made inferior to the umbilicus.  The fascia was elevated and the Veress needle inserted. Proper position was confirmed by aspiration and saline meniscus test. The abdomen was insufflated with carbon dioxide to a pressure of 15 mmHg. The patient tolerated insufflation well.  Needle removed and 12mm trocar placed. The laparoscope was inserted and the abdomen inspected. No injuries from initial trocar placement were noted. One 5mm port and another 5-mm port was then placed above the symphysis pubis on midline and LLQ.  Care was taken to avoid injury to the bladder or inferior epigastric vessels. The table was placed in the Trendelenburg position with the right side elevated.  Extensive phlegmon noted surrounding the cecum.  Careful dissection revealed a severely inflammed but intact appendix extending from a simliarly  inflammed but intact cecal base with tip extending toward the ileum.  The tip was gently dissected off the ileum and omentum that was covering it.  The appendicial mesentery adhesion were dissected off surrounding structure before an endoscopic blue load linear cutting stapler was then used to divide the thickened mesentery.  The base of the appendix was cleared of as much phelgmon as possible prior to stapling off with another blue load stapler. The appendix was placed in an endoscopic retrieval bag and removed.   The appendiceal stump was examined and hemostasis noted. No other pathology was identified within pelvis.  Extensive irrigation and suctioning completed.  Hemostasis still noted.  Shellia Carwinarter Thomason needle used to close fascia at 12mm port.  Trocars were removed under direct vision and abdomen desufflated.  Deep dermal then closed with 3-0 vicryl interrupted fashion.  All skin incisions then closed with subcuticular sutures Monocryl 4-0.  Wounds then dressed with dermabond.  The patient tolerated the procedure well, foley removed, awakened from anesthesia and was taken to the postanesthesia care unit in satisfactory condition.

## 2017-12-20 NOTE — ED Triage Notes (Signed)
Presents with RLQ pain since Friday  Pain has gotten worse   Positive n/v   Fever at home last pm of 100.5

## 2017-12-20 NOTE — Anesthesia Preprocedure Evaluation (Signed)
Anesthesia Evaluation  Patient identified by MRN, date of birth, ID band Patient awake    Reviewed: Allergy & Precautions, NPO status , Patient's Chart, lab work & pertinent test results, reviewed documented beta blocker date and time   Airway Mallampati: III  TM Distance: >3 FB     Dental  (+) Chipped   Pulmonary former smoker,           Cardiovascular      Neuro/Psych    GI/Hepatic   Endo/Other    Renal/GU      Musculoskeletal   Abdominal   Peds  Hematology   Anesthesia Other Findings EKG ok.  Reproductive/Obstetrics                             Anesthesia Physical Anesthesia Plan  ASA: II  Anesthesia Plan: General   Post-op Pain Management:    Induction: Intravenous  PONV Risk Score and Plan:   Airway Management Planned: Oral ETT  Additional Equipment:   Intra-op Plan:   Post-operative Plan:   Informed Consent: I have reviewed the patients History and Physical, chart, labs and discussed the procedure including the risks, benefits and alternatives for the proposed anesthesia with the patient or authorized representative who has indicated his/her understanding and acceptance.     Plan Discussed with: CRNA  Anesthesia Plan Comments:         Anesthesia Quick Evaluation

## 2017-12-20 NOTE — Anesthesia Post-op Follow-up Note (Signed)
Anesthesia QCDR form completed.        

## 2017-12-20 NOTE — Anesthesia Procedure Notes (Signed)
Procedure Name: Intubation Date/Time: 12/20/2017 7:05 PM Performed by: Allean Found, CRNA Pre-anesthesia Checklist: Patient identified, Patient being monitored, Timeout performed, Emergency Drugs available and Suction available Patient Re-evaluated:Patient Re-evaluated prior to induction Oxygen Delivery Method: Circle system utilized Preoxygenation: Pre-oxygenation with 100% oxygen Induction Type: IV induction Ventilation: Mask ventilation without difficulty Laryngoscope Size: Mac and 4 Grade View: Grade I Tube type: Oral Tube size: 7.0 mm Number of attempts: 1 Airway Equipment and Method: Stylet Placement Confirmation: ETT inserted through vocal cords under direct vision,  positive ETCO2 and breath sounds checked- equal and bilateral Secured at: 22 cm Tube secured with: Tape Dental Injury: Teeth and Oropharynx as per pre-operative assessment

## 2017-12-21 ENCOUNTER — Encounter: Payer: Self-pay | Admitting: Surgery

## 2017-12-21 LAB — BASIC METABOLIC PANEL
Anion gap: 11 (ref 5–15)
BUN: 18 mg/dL (ref 6–20)
CALCIUM: 8.4 mg/dL — AB (ref 8.9–10.3)
CO2: 27 mmol/L (ref 22–32)
Chloride: 100 mmol/L (ref 98–111)
Creatinine, Ser: 1.36 mg/dL — ABNORMAL HIGH (ref 0.61–1.24)
Glucose, Bld: 145 mg/dL — ABNORMAL HIGH (ref 70–99)
POTASSIUM: 4.3 mmol/L (ref 3.5–5.1)
SODIUM: 138 mmol/L (ref 135–145)

## 2017-12-21 LAB — CBC
HCT: 44.3 % (ref 40.0–52.0)
Hemoglobin: 15.3 g/dL (ref 13.0–18.0)
MCH: 30.5 pg (ref 26.0–34.0)
MCHC: 34.6 g/dL (ref 32.0–36.0)
MCV: 88.3 fL (ref 80.0–100.0)
PLATELETS: 152 10*3/uL (ref 150–440)
RBC: 5.02 MIL/uL (ref 4.40–5.90)
RDW: 14.1 % (ref 11.5–14.5)
WBC: 14.6 10*3/uL — ABNORMAL HIGH (ref 3.8–10.6)

## 2017-12-21 LAB — PHOSPHORUS: Phosphorus: 4.3 mg/dL (ref 2.5–4.6)

## 2017-12-21 LAB — MAGNESIUM: MAGNESIUM: 2.5 mg/dL — AB (ref 1.7–2.4)

## 2017-12-21 MED ORDER — ZOLPIDEM TARTRATE 5 MG PO TABS
5.0000 mg | ORAL_TABLET | Freq: Every evening | ORAL | Status: DC | PRN
  Administered 2017-12-21: 5 mg via ORAL
  Filled 2017-12-21: qty 1

## 2017-12-21 NOTE — Progress Notes (Signed)
Subjective:    Garrett Mcpherson is a 37 y.o. male  Hospital stay day 0, 1 Day Post-Op lap appy for acute appendicitis with phelgmon  No issues overnight.  Tolerating clears, feeling much better   Objective:      Temp:  [97.2 F (36.2 C)-98.9 F (37.2 C)] 97.5 F (36.4 C) (08/12 0617) Pulse Rate:  [68-103] 68 (08/12 0617) Resp:  [9-20] 16 (08/12 0617) BP: (103-127)/(60-92) 108/72 (08/12 0617) SpO2:  [88 %-100 %] 96 % (08/12 0617)     Height: 6\' 2"  (188 cm) Weight: 108.9 kg BMI (Calculated): 30.8   Intake/Output this shift:  Total I/O In: 720 [P.O.:720] Out: -     Constitutional :  alert, cooperative, appears stated age and no distress  Gastrointestinal: soft, non-tender; bowel sounds normal; no masses,  no organomegaly.   Skin: Cool and moist. Port site incisions c/d/i  Psychiatric: Normal affect, non-agitated, not confused       LABS:  CMP Latest Ref Rng & Units 12/21/2017 12/20/2017  Glucose 70 - 99 mg/dL 161(W145(H) 960(A113(H)  BUN 6 - 20 mg/dL 18 15  Creatinine 5.400.61 - 1.24 mg/dL 9.81(X1.36(H) 9.14(N1.26(H)  Sodium 135 - 145 mmol/L 138 139  Potassium 3.5 - 5.1 mmol/L 4.3 4.0  Chloride 98 - 111 mmol/L 100 101  CO2 22 - 32 mmol/L 27 30  Calcium 8.9 - 10.3 mg/dL 8.2(N8.4(L) 5.6(O8.7(L)  Total Protein 6.5 - 8.1 g/dL - 7.2  Total Bilirubin 0.3 - 1.2 mg/dL - 1.5(H)  Alkaline Phos 38 - 126 U/L - 63  AST 15 - 41 U/L - 22  ALT 0 - 44 U/L - 25   CBC Latest Ref Rng & Units 12/21/2017 12/20/2017  WBC 3.8 - 10.6 K/uL 14.6(H) 17.9(H)  Hemoglobin 13.0 - 18.0 g/dL 13.015.3 86.516.3  Hematocrit 78.440.0 - 52.0 % 44.3 48.1  Platelets 150 - 440 K/uL 152 173    RADS: n/a Assessment:   S/p lap appy for acute appendicitis with phelgmon.  Wbc decreasing but still high.  Reactive vs resolving infection.  Continue IV abx for now and recheck in am. ADAT Noted bump in Cr.  Retroperitoneum not within operative field so doubt any ureteral injury.  Pt voiding without any issue.  Reactive vs dehydration from phelgmatous  appendicitis.  Will continue IVF for today and reassess in am. I did explain to patients to stay inhouse for reasons above, but he states he still may request to be d/c today.  Possible increased risk of recurrent, persistent infection and worsening kidney function without close followup discussed and he verbalized understanding.  Agrees to stay inhouse for now

## 2017-12-21 NOTE — Progress Notes (Signed)
Patient requested something for sleep. I spoke with Dr. Sakai and he oTonna Boehringerrdered Ambien PRN for sleep. Anselm Junglingonyers,Kaaliyah Kita M

## 2017-12-22 LAB — CBC WITH DIFFERENTIAL/PLATELET
BASOS ABS: 0 10*3/uL (ref 0–0.1)
BASOS PCT: 0 %
EOS ABS: 0 10*3/uL (ref 0–0.7)
EOS PCT: 0 %
HCT: 38.2 % — ABNORMAL LOW (ref 40.0–52.0)
Hemoglobin: 13.1 g/dL (ref 13.0–18.0)
Lymphocytes Relative: 19 %
Lymphs Abs: 2.4 10*3/uL (ref 1.0–3.6)
MCH: 30.2 pg (ref 26.0–34.0)
MCHC: 34.3 g/dL (ref 32.0–36.0)
MCV: 88.3 fL (ref 80.0–100.0)
Monocytes Absolute: 1 10*3/uL (ref 0.2–1.0)
Monocytes Relative: 8 %
Neutro Abs: 8.9 10*3/uL — ABNORMAL HIGH (ref 1.4–6.5)
Neutrophils Relative %: 73 %
PLATELETS: 142 10*3/uL — AB (ref 150–440)
RBC: 4.33 MIL/uL — AB (ref 4.40–5.90)
RDW: 14.1 % (ref 11.5–14.5)
WBC: 12.4 10*3/uL — AB (ref 3.8–10.6)

## 2017-12-22 LAB — SURGICAL PATHOLOGY

## 2017-12-22 LAB — HIV ANTIBODY (ROUTINE TESTING W REFLEX): HIV SCREEN 4TH GENERATION: NONREACTIVE

## 2017-12-22 MED ORDER — IBUPROFEN 800 MG PO TABS: 800.0000 mg | ORAL_TABLET | Freq: Three times a day (TID) | ORAL | 0 refills | Status: DC | PRN

## 2017-12-22 MED ORDER — AMOXICILLIN-POT CLAVULANATE 875-125 MG PO TABS: 1.0000 | ORAL_TABLET | Freq: Two times a day (BID) | ORAL | 0 refills | Status: AC

## 2017-12-22 MED ORDER — DOCUSATE SODIUM 100 MG PO CAPS: 100.0000 mg | ORAL_CAPSULE | Freq: Two times a day (BID) | ORAL | 0 refills | Status: AC | PRN

## 2017-12-22 MED ORDER — HYDROCODONE-ACETAMINOPHEN 5-325 MG PO TABS: 1.0000 | ORAL_TABLET | ORAL | 0 refills | Status: DC | PRN

## 2017-12-22 NOTE — Progress Notes (Signed)
Patient cleared for discharge by Dr Tonna BoehringerSakai Education complete. AVS printed. Discharge instructions given. All questions answered for patient clarification. to           Prescriptions given, pharmacy verified.  IV removed.   Discharged to home by POV

## 2017-12-22 NOTE — Discharge Instructions (Signed)
Laparoscopic Appendectomy, Adult, Care After °Refer to this sheet in the next few weeks. These instructions provide you with information about caring for yourself after your procedure. Your health care provider may also give you more specific instructions. Your treatment has been planned according to current medical practices, but problems sometimes occur. Call your health care provider if you have any problems or questions after your procedure. °What can I expect after the procedure? °After the procedure, it is common to have: °· A decrease in your energy level. °· Mild pain in the area where the surgical cuts (incisions) were made. °· Constipation. This can be caused by pain medicine and a decrease in your activity. ° °Follow these instructions at home: °Medicines °· Take over-the-counter and prescription medicines only as told by your health care provider. °· Do not drive for 24 hours if you received a sedative. °· Do not drive or operate heavy machinery while taking prescription pain medicine. °· If you were prescribed an antibiotic medicine, take it as told by your health care provider. Do not stop taking the antibiotic even if you start to feel better. °Activity °· For 3 weeks or as long as told by your health care provider: °? Do not lift anything that is heavier than 10 pounds (4.5 kg). °? Do not play contact sports. °· Gradually return to your normal activities. Ask your health care provider what activities are safe for you. °Bathing °· Keep your incisions clean and dry. Clean them as often as told by your health care provider: °? Gently wash the incisions with soap and water. °? Rinse the incisions with water to remove all soap. °? Pat the incisions dry with a clean towel. Do not rub the incisions. °· You may take showers after 48 hours. °· Do not take baths, swim, or use hot tubs for 2 weeks or as told by your health care provider. °Incision care °· Follow instructions from your healthcare provider about  how to take care of your incisions. Make sure you: °? Wash your hands with soap and water before you change your bandage (dressing). If soap and water are not available, use hand sanitizer. °? Change your dressing as told by your health care provider. °? Leave stitches (sutures), skin glue, or adhesive strips in place. These skin closures may need to stay in place for 2 weeks or longer. If adhesive strip edges start to loosen and curl up, you may trim the loose edges. Do not remove adhesive strips completely unless your health care provider tells you to do that. °· Check your incision areas every day for signs of infection. Check for: °? More redness, swelling, or pain. °? More fluid or blood. °? Warmth. °? Pus or a bad smell. °Other Instructions °· If you were sent home with a drain, follow instructions from your health care provider about how to care for the drain and how to empty it. °· Take deep breaths. This helps to prevent your lungs from becoming inflamed. °· To relieve and prevent constipation: °? Drink plenty of fluids. °? Eat plenty of fruits and vegetables. °· Keep all follow-up visits as told by your health care provider. This is important. °Contact a health care provider if: °· You have more redness, swelling, or pain around an incision. °· You have more fluid or blood coming from an incision. °· Your incision feels warm to the touch. °· You have pus or a bad smell coming from an incision or dressing. °· Your incision   edges break open after your sutures have been removed. °· You have increasing pain in your shoulders. °· You feel dizzy or you faint. °· You develop shortness of breath. °· You keep feeling nauseous or vomiting. °· You have diarrhea or you cannot control your bowel functions. °· You lose your appetite. °· You develop swelling or pain in your legs. °Get help right away if: °· You have a fever. °· You develop a rash. °· You have difficulty breathing. °· You have sharp pains in your  chest. °This information is not intended to replace advice given to you by your health care provider. Make sure you discuss any questions you have with your health care provider. °Document Released: 04/28/2005 Document Revised: 09/28/2015 Document Reviewed: 10/16/2014 °Elsevier Interactive Patient Education © 2018 [employer] ° °

## 2017-12-22 NOTE — Discharge Summary (Signed)
Physician Discharge Summary  Patient ID: Garrett Mcpherson L Craker MRN: [id number] DOB/AGE: [birth year] 37 y.o.  Admit date: 12/20/2017 Discharge date: 12/22/2017  Admission Diagnoses: acute appendiciti  Discharge Diagnoses:  Same as above  Discharged Condition: good  Hospital Course: Patient was admitted to the emergency department for acute appendicitis taken in for a laparoscopic appendectomy.  The appendix was heavily surrounded by phlegmon but no obvious abscess or perforation was noted at the time.  Please see the operative note for further details.  We continued antibiotics due to the advanced nature of the appendicitis throughout his entire hospital stay.  On day of discharge the white count was continued to trend down patient was tolerating a diet voiding having bowel movements and pain control with oral meds without any issues.  Due to outside I will get obligations patient's requested to be discharged with close follow-up with his labs as an outpatient basis since he was clinically looking much improved and essentially asymptomatic at this point we agreed for close outpatient follow-up regarding his decreasing leukocytosis as well as his slight bump in his creatinine level.  Consults: None  Discharge Exam: Blood pressure 115/74, pulse 69, temperature 97.6 F (36.4 C), temperature source Oral, resp. rate 16, height 6\' 2"  (1.88 m), weight 108.9 kg, SpO2 95 %. General appearance: alert, cooperative and no distress GI: soft, non-tender; bowel sounds normal; no masses,  no organomegaly Incision/Wound: port sites c/d/i  Disposition:  Discharge disposition: 01-Home or Self Care        Allergies as of 12/22/2017   No Known Allergies     Medication List    TAKE these medications   amoxicillin-clavulanate 875-125 MG tablet Commonly known as:  AUGMENTIN Take 1 tablet by mouth 2 (two) times daily for 10 days.   docusate sodium 100 MG capsule Commonly known as:  COLACE Take 1  capsule (100 mg total) by mouth 2 (two) times daily as needed for up to 10 days for mild constipation.   HYDROcodone-acetaminophen 5-325 MG tablet Commonly known as:  NORCO/VICODIN Take 1-2 tablets by mouth every 4 (four) hours as needed for moderate pain.   ibuprofen 800 MG tablet Commonly known as:  ADVIL,MOTRIN Take 1 tablet (800 mg total) by mouth every 8 (eight) hours as needed.   multivitamin capsule Take 1 capsule by mouth daily.      Follow-up Information    Tonna BoehringerSakai, Kanylah Muench, DO Follow up in 2 week(s).   Specialty:  Surgery Why:  Please schedule appointment to obtain ordered labs on the morning of followup in clinic Contact information: [address] [place] [address] [phone]            Total time spent arranging discharge was >530min. Signed: Sung Amabilesami Sipriano Fendley 12/22/2017, 6:35 AM

## 2018-03-22 DIAGNOSIS — M542 Cervicalgia: Secondary | ICD-10-CM | POA: Insufficient documentation

## 2018-03-23 ENCOUNTER — Other Ambulatory Visit: Payer: Self-pay | Admitting: Orthopedic Surgery

## 2018-03-23 DIAGNOSIS — M542 Cervicalgia: Secondary | ICD-10-CM

## 2018-04-26 ENCOUNTER — Other Ambulatory Visit: Payer: Self-pay

## 2018-04-26 ENCOUNTER — Inpatient Hospital Stay
Admission: EM | Admit: 2018-04-26 | Discharge: 2018-04-29 | DRG: 331 | Disposition: A | Discharge status: Home / Self Care | Payer: BLUE CROSS/BLUE SHIELD | Attending: Surgery | Admitting: Surgery

## 2018-04-26 ENCOUNTER — Emergency Department: Payer: BLUE CROSS/BLUE SHIELD

## 2018-04-26 ENCOUNTER — Encounter: Payer: Self-pay | Admitting: Emergency Medicine

## 2018-04-26 DIAGNOSIS — K37 Unspecified appendicitis: Secondary | ICD-10-CM | POA: Diagnosis present

## 2018-04-26 DIAGNOSIS — Z79899 Other long term (current) drug therapy: Secondary | ICD-10-CM | POA: Diagnosis not present

## 2018-04-26 DIAGNOSIS — Z87891 Personal history of nicotine dependence: Secondary | ICD-10-CM | POA: Diagnosis not present

## 2018-04-26 DIAGNOSIS — K36 Other appendicitis: Principal | ICD-10-CM | POA: Diagnosis present

## 2018-04-26 DIAGNOSIS — K3580 Unspecified acute appendicitis: Secondary | ICD-10-CM

## 2018-04-26 DIAGNOSIS — K3532 Acute appendicitis with perforation and localized peritonitis, without abscess: Secondary | ICD-10-CM

## 2018-04-26 DIAGNOSIS — Z5331 Laparoscopic surgical procedure converted to open procedure: Secondary | ICD-10-CM | POA: Diagnosis not present

## 2018-04-26 LAB — CBC
HCT: 47.2 % (ref 39.0–52.0)
Hemoglobin: 15.6 g/dL (ref 13.0–17.0)
MCH: 28.7 pg (ref 26.0–34.0)
MCHC: 33.1 g/dL (ref 30.0–36.0)
MCV: 86.8 fL (ref 80.0–100.0)
Platelets: 188 10*3/uL (ref 150–400)
RBC: 5.44 MIL/uL (ref 4.22–5.81)
RDW: 13.2 % (ref 11.5–15.5)
WBC: 14.8 10*3/uL — ABNORMAL HIGH (ref 4.0–10.5)
nRBC: 0 % (ref 0.0–0.2)

## 2018-04-26 LAB — URINALYSIS, ROUTINE W REFLEX MICROSCOPIC
BILIRUBIN URINE: NEGATIVE
GLUCOSE, UA: NEGATIVE mg/dL
Hgb urine dipstick: NEGATIVE
Ketones, ur: NEGATIVE mg/dL
Leukocytes, UA: NEGATIVE
NITRITE: NEGATIVE
Protein, ur: NEGATIVE mg/dL
SPECIFIC GRAVITY, URINE: 1.018 (ref 1.005–1.030)
pH: 8 (ref 5.0–8.0)

## 2018-04-26 LAB — COMPREHENSIVE METABOLIC PANEL
ALK PHOS: 68 U/L (ref 38–126)
ALT: 33 U/L (ref 0–44)
ANION GAP: 8 (ref 5–15)
AST: 21 U/L (ref 15–41)
Albumin: 4.4 g/dL (ref 3.5–5.0)
BUN: 14 mg/dL (ref 6–20)
CALCIUM: 9.2 mg/dL (ref 8.9–10.3)
CO2: 27 mmol/L (ref 22–32)
Chloride: 102 mmol/L (ref 98–111)
Creatinine, Ser: 1.21 mg/dL (ref 0.61–1.24)
GFR calc non Af Amer: >60 mL/min (ref >60)
Glucose, Bld: 95 mg/dL (ref 70–99)
Potassium: 3.7 mmol/L (ref 3.5–5.1)
Sodium: 137 mmol/L (ref 135–145)
Total Bilirubin: 1.2 mg/dL (ref 0.3–1.2)
Total Protein: 7.6 g/dL (ref 6.5–8.1)

## 2018-04-26 LAB — SURGICAL PCR SCREEN
MRSA, PCR: NEGATIVE
STAPHYLOCOCCUS AUREUS: NEGATIVE

## 2018-04-26 LAB — LIPASE, BLOOD: Lipase: 30 U/L (ref 11–51)

## 2018-04-26 MED ORDER — SODIUM CHLORIDE 0.9 % IV SOLN
INTRAVENOUS | Status: AC
  Administered 2018-04-26: 2 g via INTRAVENOUS
  Filled 2018-04-26: qty 20

## 2018-04-26 MED ORDER — CHLORHEXIDINE GLUCONATE CLOTH 2 % EX PADS
6.0000 | MEDICATED_PAD | Freq: Once | CUTANEOUS | Status: AC
  Administered 2018-04-26: 6 via TOPICAL

## 2018-04-26 MED ORDER — ONDANSETRON 4 MG PO TBDP: 4.0000 mg | ORAL_TABLET | Freq: Four times a day (QID) | ORAL | Status: DC | PRN

## 2018-04-26 MED ORDER — MUPIROCIN 2 % EX OINT
1.0000 "application " | TOPICAL_OINTMENT | Freq: Two times a day (BID) | CUTANEOUS | Status: DC
  Filled 2018-04-26: qty 22

## 2018-04-26 MED ORDER — MORPHINE SULFATE (PF) 2 MG/ML IV SOLN
2.0000 mg | INTRAVENOUS | Status: DC | PRN
  Administered 2018-04-27 – 2018-04-28 (×4): 2 mg via INTRAVENOUS
  Filled 2018-04-26 (×4): qty 1

## 2018-04-26 MED ORDER — OXYCODONE-ACETAMINOPHEN 5-325 MG PO TABS
1.0000 | ORAL_TABLET | ORAL | Status: DC | PRN
  Administered 2018-04-28: 1 via ORAL
  Administered 2018-04-28 – 2018-04-29 (×2): 2 via ORAL
  Filled 2018-04-26 (×2): qty 2
  Filled 2018-04-26: qty 1

## 2018-04-26 MED ORDER — ZOLPIDEM TARTRATE 5 MG PO TABS
10.0000 mg | ORAL_TABLET | Freq: Once | ORAL | Status: AC
  Administered 2018-04-26: 10 mg via ORAL
  Filled 2018-04-26: qty 2

## 2018-04-26 MED ORDER — SODIUM CHLORIDE 0.9 % IV SOLN
2.0000 g | INTRAVENOUS | Status: DC
  Administered 2018-04-26 – 2018-04-28 (×2): 2 g via INTRAVENOUS
  Filled 2018-04-26 (×2): qty 20
  Filled 2018-04-26: qty 2
  Filled 2018-04-26: qty 20

## 2018-04-26 MED ORDER — IOPAMIDOL (ISOVUE-300) INJECTION 61%
30.0000 mL | Freq: Once | INTRAVENOUS | Status: AC | PRN
  Administered 2018-04-26: 30 mL via ORAL
  Filled 2018-04-26: qty 30

## 2018-04-26 MED ORDER — KETOROLAC TROMETHAMINE 30 MG/ML IJ SOLN
INTRAMUSCULAR | Status: AC
  Filled 2018-04-26: qty 1

## 2018-04-26 MED ORDER — METRONIDAZOLE IN NACL 5-0.79 MG/ML-% IV SOLN
500.0000 mg | Freq: Three times a day (TID) | INTRAVENOUS | Status: DC
  Administered 2018-04-26 – 2018-04-29 (×8): 500 mg via INTRAVENOUS
  Filled 2018-04-26 (×13): qty 100

## 2018-04-26 MED ORDER — DEXTROSE IN LACTATED RINGERS 5 % IV SOLN
INTRAVENOUS | Status: DC
  Administered 2018-04-26 – 2018-04-27 (×2): via INTRAVENOUS

## 2018-04-26 MED ORDER — METRONIDAZOLE IN NACL 5-0.79 MG/ML-% IV SOLN
INTRAVENOUS | Status: AC
  Administered 2018-04-26: 500 mg via INTRAVENOUS
  Filled 2018-04-26: qty 100

## 2018-04-26 MED ORDER — IOPAMIDOL (ISOVUE-300) INJECTION 61%
100.0000 mL | Freq: Once | INTRAVENOUS | Status: AC | PRN
Start: 2018-04-26 — End: 2018-04-26
  Administered 2018-04-26: 100 mL via INTRAVENOUS
  Filled 2018-04-26: qty 100

## 2018-04-26 MED ORDER — KETOROLAC TROMETHAMINE 30 MG/ML IJ SOLN
30.0000 mg | Freq: Four times a day (QID) | INTRAMUSCULAR | Status: AC
  Administered 2018-04-26 – 2018-04-27 (×2): 30 mg via INTRAVENOUS
  Filled 2018-04-26: qty 1

## 2018-04-26 MED ORDER — HEPARIN SODIUM (PORCINE) 5000 UNIT/ML IJ SOLN
5000.0000 [IU] | Freq: Three times a day (TID) | INTRAMUSCULAR | Status: DC
  Administered 2018-04-26 – 2018-04-27 (×2): 5000 [IU] via SUBCUTANEOUS
  Filled 2018-04-26 (×2): qty 1

## 2018-04-26 MED ORDER — ONDANSETRON HCL 4 MG/2ML IJ SOLN
4.0000 mg | Freq: Four times a day (QID) | INTRAMUSCULAR | Status: DC | PRN
  Administered 2018-04-28: 4 mg via INTRAVENOUS
  Filled 2018-04-26: qty 2

## 2018-04-26 NOTE — ED Notes (Signed)
Pt off the floor at this time, transported via wheelchair by RN.

## 2018-04-26 NOTE — Progress Notes (Signed)
37 year old male with appendiceal stump appendicitis s/p laparoscopic appendectomy Tonna Boehringer(Sakai, 12/20/2017) discussed with ED physician, Dr. Lenard LancePaduchowski, CT reviewed. Will admit to surgical service with antibiotics and pain control, IV fluids, clear liquids diet, and NPO after midnight. Patient also discussed with Dr. Tonna BoehringerSakai. Full H&P to follow.  Please call if any changes, questions, or concerns.  -- Scherrie GerlachJason E. Earlene Plateravis, MD, RPVI [place]: [facility] General Surgery - Partnering for exceptional care. Office: [phone]

## 2018-04-26 NOTE — ED Triage Notes (Signed)
Lower abdominal pain since yesterday at 2pm. States had appendectomy 4 months ago. States was able to pass gas yesterday but not today. [facility] seen at [facility] [place] [facility] and had urinalysis which states was normal.

## 2018-04-26 NOTE — ED Provider Notes (Signed)
[facility] Emergency Department Provider Note  Time seen: 4:43 PM  I have reviewed the triage vital signs and the nursing notes.   HISTORY  Chief Complaint Abdominal Pain    HPI Garrett Mcpherson is a 37 y.o. male with no past medical history who presents to the emergency department for abdominal pain.  According to the patient since around 2 PM yesterday he developed lower abdominal pain especially in the lower midline of his abdomen.  Patient states he was able to pass gas shortly after developing pain and it made the pain much better, but states he is not been able to pass any gas since and the pain is continued to worsen.  Describes moderate dull pain currently.  Denies any dysuria or hematuria.  Denies any vomiting or diarrhea.  Patient did have an appendectomy performed 4 months ago.  No known fever.   Past Medical History:  Diagnosis Date  . 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 Acute appendicitis 12/20/2017  . Foreign body (FB) in soft tissue 08/21/2015    Past Surgical History:  Procedure Laterality Date  . COLONOSCOPY  2007  . LAPAROSCOPIC APPENDECTOMY N/A 12/20/2017   Procedure: APPENDECTOMY LAPAROSCOPIC;  Surgeon: Sung AmabileSakai, Isami, DO;  Location: [facility] ORS;  Service: General;  Laterality: N/A;  . UPPER GI ENDOSCOPY  2007    Prior to Admission medications   Medication Sig Start Date End Date Taking? Authorizing Provider  HYDROcodone-acetaminophen (NORCO/VICODIN) 5-325 MG tablet Take 1-2 tablets by mouth every 4 (four) hours as needed for moderate pain. 12/22/17   Tonna BoehringerSakai, Isami, DO  ibuprofen (ADVIL,MOTRIN) 800 MG tablet Take 1 tablet (800 mg total) by mouth every 8 (eight) hours as needed. 12/22/17   Sung AmabileSakai, Isami, DO  Multiple Vitamin (MULTIVITAMIN) capsule Take 1 capsule by mouth daily.    [provider]    No Known Allergies  Family History  Problem Relation Age of Onset   . Cancer Mother        breast     Social History Social History   Tobacco Use  . Smoking status: Former Smoker    Years: 10.00    Last attempt to quit: 09/14/2003    Years since quitting: 14.6  . Smokeless tobacco: Never Used  Substance Use Topics  . Alcohol use: Yes    Alcohol/week: 0.0 standard drinks    Comment: very rarely  . Drug use: No    Review of Systems Constitutional: Negative for fever Cardiovascular: Negative for chest pain. Respiratory: Negative for shortness of breath. Gastrointestinal: Lower abdominal pain, moderate.  Negative for vomiting or diarrhea.  Positive for constipation and obstipation. Genitourinary: Negative for urinary compaints Musculoskeletal: Negative for musculoskeletal complaints Skin: Negative for skin complaints  Neurological: Negative for headache All other ROS negative  ____________________________________________   PHYSICAL EXAM:  VITAL SIGNS: ED Triage Vitals [04/26/18 1543]  Enc Vitals Group     BP (!) 113/55     Pulse Rate 92     Resp 18     Temp 98.4 F (36.9 C)     Temp Source Oral     SpO2 97 %     Weight 240 lb (108.9 kg)     Height 6\' 2"  (1.88 m)     Head Circumference      Peak Flow      Pain Score 4     Pain Loc  Pain Edu?      Excl. in GC?    Constitutional: Alert and oriented. Well appearing and in no distress. Eyes: Normal exam ENT   Head: Normocephalic and atraumatic   Mouth/Throat: Mucous membranes are moist. Cardiovascular: Normal rate, regular rhythm. No murmur Respiratory: Normal respiratory effort without tachypnea nor retractions. Breath sounds are clear Gastrointestinal: Soft, moderate suprapubic tenderness palpation with mild diffuse lower abdominal tenderness.  No rebound or guarding.  No distention. Musculoskeletal: Nontender with normal range of motion in all extremities.  Neurologic:  Normal speech and language. No gross focal neurologic deficits Skin:  Skin is warm, dry and  intact.  Psychiatric: Mood and affect are normal.   ____________________________________________   RADIOLOGY  CT consistent with stump appendicitis  ____________________________________________   INITIAL IMPRESSION / ASSESSMENT AND PLAN / ED COURSE  Pertinent labs & imaging results that were available during my care of the patient were reviewed by me and considered in my medical decision making (see chart for details).  Patient presents emergency department for lower abdominal pain for the past 2 days.  Patient was seen by the doctor at his work had a normal urinalysis and was referred to the ER for further evaluation.  Differential at this time would include stump appendicitis, intra-abdominal abscess, urinary tract infection or pyelonephritis, colitis or diverticulitis, SBO.  Patient's lab work shows a moderate leukocytosis of 14,800 otherwise normal.  We will obtain CT imaging the abdomen/pelvis to further evaluate.  Patient agreeable to plan of care.  CT consistent with stump appendicitis.  I discussed the patient with Dr. Earlene Plater of general surgery who will be admitting to his service.  ____________________________________________   FINAL CLINICAL IMPRESSION(S) / ED DIAGNOSES  Lower abdominal pain Stump appendicitis   Minna Antis, MD 04/26/18 1610

## 2018-04-26 NOTE — ED Triage Notes (Signed)
First Nurse Note:  Arrives for ED evaluation of severe abdominal pain.  Patient is AAOx3.  Skin warm and dry.  Posture upright and relaxed.  NAD

## 2018-04-26 NOTE — ED Notes (Signed)
Introduced to patient and family, pt resting comfortably, second abx initiated, updated on POC, Pt provided with something to drink at this time. Informed that he will be NPO starting at midnight.

## 2018-04-27 ENCOUNTER — Inpatient Hospital Stay: Payer: BLUE CROSS/BLUE SHIELD | Admitting: Anesthesiology

## 2018-04-27 ENCOUNTER — Encounter: Payer: Self-pay | Admitting: *Deleted

## 2018-04-27 ENCOUNTER — Encounter
Admission: EM | Disposition: A | Discharge status: Home / Self Care | Payer: Self-pay | Source: Home / Self Care | Attending: Surgery

## 2018-04-27 HISTORY — PX: LAPAROSCOPIC APPENDECTOMY: SHX408

## 2018-04-27 HISTORY — PX: APPENDECTOMY: SHX54

## 2018-04-27 LAB — CBC
HCT: 43.9 % (ref 39.0–52.0)
Hemoglobin: 14.3 g/dL (ref 13.0–17.0)
MCH: 29.1 pg (ref 26.0–34.0)
MCHC: 32.6 g/dL (ref 30.0–36.0)
MCV: 89.2 fL (ref 80.0–100.0)
Platelets: 146 10*3/uL — ABNORMAL LOW (ref 150–400)
RBC: 4.92 MIL/uL (ref 4.22–5.81)
RDW: 13.1 % (ref 11.5–15.5)
WBC: 10.6 10*3/uL — ABNORMAL HIGH (ref 4.0–10.5)
nRBC: 0 % (ref 0.0–0.2)

## 2018-04-27 LAB — BASIC METABOLIC PANEL
Anion gap: 7 (ref 5–15)
BUN: 18 mg/dL (ref 6–20)
CO2: 28 mmol/L (ref 22–32)
CREATININE: 1.24 mg/dL (ref 0.61–1.24)
Calcium: 8.5 mg/dL — ABNORMAL LOW (ref 8.9–10.3)
Chloride: 103 mmol/L (ref 98–111)
GFR calc Af Amer: >60 mL/min (ref >60)
GFR calc non Af Amer: >60 mL/min (ref >60)
Glucose, Bld: 112 mg/dL — ABNORMAL HIGH (ref 70–99)
Potassium: 3.7 mmol/L (ref 3.5–5.1)
Sodium: 138 mmol/L (ref 135–145)

## 2018-04-27 SURGERY — APPENDECTOMY, LAPAROSCOPIC
Anesthesia: General | Site: Abdomen

## 2018-04-27 MED ORDER — LIDOCAINE HCL (PF) 2 % IJ SOLN
INTRAMUSCULAR | Status: AC
  Filled 2018-04-27: qty 10

## 2018-04-27 MED ORDER — BUPIVACAINE-EPINEPHRINE 0.25% -1:200000 IJ SOLN
INTRAMUSCULAR | Status: DC | PRN
  Administered 2018-04-27: 20 mL
  Administered 2018-04-27: 5 mL

## 2018-04-27 MED ORDER — HYDROMORPHONE HCL 1 MG/ML IJ SOLN
INTRAMUSCULAR | Status: AC
  Administered 2018-04-27: 0.5 mg via INTRAVENOUS
  Filled 2018-04-27: qty 1

## 2018-04-27 MED ORDER — PROPOFOL 10 MG/ML IV BOLUS
INTRAVENOUS | Status: AC
  Filled 2018-04-27: qty 20

## 2018-04-27 MED ORDER — KETOROLAC TROMETHAMINE 30 MG/ML IJ SOLN
INTRAMUSCULAR | Status: AC
  Filled 2018-04-27: qty 1

## 2018-04-27 MED ORDER — PROPOFOL 10 MG/ML IV BOLUS
INTRAVENOUS | Status: DC | PRN
  Administered 2018-04-27: 200 mg via INTRAVENOUS

## 2018-04-27 MED ORDER — HYDROMORPHONE HCL 1 MG/ML IJ SOLN
INTRAMUSCULAR | Status: AC
  Filled 2018-04-27: qty 1

## 2018-04-27 MED ORDER — FENTANYL CITRATE (PF) 250 MCG/5ML IJ SOLN
INTRAMUSCULAR | Status: AC
  Filled 2018-04-27: qty 5

## 2018-04-27 MED ORDER — DEXAMETHASONE SODIUM PHOSPHATE 10 MG/ML IJ SOLN
INTRAMUSCULAR | Status: DC | PRN
  Administered 2018-04-27: 10 mg via INTRAVENOUS

## 2018-04-27 MED ORDER — LACTATED RINGERS IV SOLN
INTRAVENOUS | Status: DC | PRN
  Administered 2018-04-27 (×2): via INTRAVENOUS

## 2018-04-27 MED ORDER — MIDAZOLAM HCL 2 MG/2ML IJ SOLN
INTRAMUSCULAR | Status: DC | PRN
  Administered 2018-04-27: 2 mg via INTRAVENOUS

## 2018-04-27 MED ORDER — ROCURONIUM BROMIDE 50 MG/5ML IV SOLN
INTRAVENOUS | Status: AC
  Filled 2018-04-27: qty 1

## 2018-04-27 MED ORDER — ONDANSETRON HCL 4 MG/2ML IJ SOLN
INTRAMUSCULAR | Status: DC | PRN
  Administered 2018-04-27 (×2): 4 mg via INTRAVENOUS

## 2018-04-27 MED ORDER — BUPIVACAINE-EPINEPHRINE (PF) 0.25% -1:200000 IJ SOLN
INTRAMUSCULAR | Status: AC
  Filled 2018-04-27: qty 30

## 2018-04-27 MED ORDER — FENTANYL CITRATE (PF) 100 MCG/2ML IJ SOLN
INTRAMUSCULAR | Status: AC
  Filled 2018-04-27: qty 2

## 2018-04-27 MED ORDER — SUCCINYLCHOLINE CHLORIDE 20 MG/ML IJ SOLN
INTRAMUSCULAR | Status: AC
  Filled 2018-04-27: qty 1

## 2018-04-27 MED ORDER — SUGAMMADEX SODIUM 200 MG/2ML IV SOLN
INTRAVENOUS | Status: AC
  Filled 2018-04-27: qty 4

## 2018-04-27 MED ORDER — ACETAMINOPHEN 10 MG/ML IV SOLN
INTRAVENOUS | Status: AC
  Filled 2018-04-27: qty 100

## 2018-04-27 MED ORDER — FENTANYL CITRATE (PF) 100 MCG/2ML IJ SOLN
25.0000 ug | INTRAMUSCULAR | Status: DC | PRN
  Administered 2018-04-27 (×3): 50 ug via INTRAVENOUS

## 2018-04-27 MED ORDER — SODIUM CHLORIDE 0.9 % IV SOLN
INTRAVENOUS | Status: DC | PRN
  Administered 2018-04-27: 60 mL

## 2018-04-27 MED ORDER — PROMETHAZINE HCL 25 MG/ML IJ SOLN: 6.2500 mg | INTRAMUSCULAR | Status: DC | PRN

## 2018-04-27 MED ORDER — MIDAZOLAM HCL 2 MG/2ML IJ SOLN
INTRAMUSCULAR | Status: AC
  Filled 2018-04-27: qty 2

## 2018-04-27 MED ORDER — EPHEDRINE SULFATE 50 MG/ML IJ SOLN
INTRAMUSCULAR | Status: DC | PRN
  Administered 2018-04-27 (×2): 10 mg via INTRAVENOUS

## 2018-04-27 MED ORDER — KCL IN DEXTROSE-NACL 40-5-0.45 MEQ/L-%-% IV SOLN
INTRAVENOUS | Status: DC
  Administered 2018-04-27 – 2018-04-29 (×3): via INTRAVENOUS
  Filled 2018-04-27 (×5): qty 1000

## 2018-04-27 MED ORDER — ROCURONIUM BROMIDE 100 MG/10ML IV SOLN
INTRAVENOUS | Status: DC | PRN
  Administered 2018-04-27: 10 mg via INTRAVENOUS
  Administered 2018-04-27: 50 mg via INTRAVENOUS
  Administered 2018-04-27: 10 mg via INTRAVENOUS
  Administered 2018-04-27 (×2): 20 mg via INTRAVENOUS
  Administered 2018-04-27: 10 mg via INTRAVENOUS
  Administered 2018-04-27: 20 mg via INTRAVENOUS

## 2018-04-27 MED ORDER — HYDROMORPHONE HCL 1 MG/ML IJ SOLN
0.5000 mg | INTRAMUSCULAR | Status: AC | PRN
  Administered 2018-04-27 (×4): 0.5 mg via INTRAVENOUS

## 2018-04-27 MED ORDER — FENTANYL CITRATE (PF) 100 MCG/2ML IJ SOLN
INTRAMUSCULAR | Status: DC | PRN
  Administered 2018-04-27 (×2): 50 ug via INTRAVENOUS
  Administered 2018-04-27: 100 ug via INTRAVENOUS
  Administered 2018-04-27: 50 ug via INTRAVENOUS

## 2018-04-27 MED ORDER — SUGAMMADEX SODIUM 200 MG/2ML IV SOLN
INTRAVENOUS | Status: DC | PRN
  Administered 2018-04-27: 250 mg via INTRAVENOUS

## 2018-04-27 MED ORDER — BUPIVACAINE LIPOSOME 1.3 % IJ SUSP
INTRAMUSCULAR | Status: AC
  Filled 2018-04-27: qty 20

## 2018-04-27 MED ORDER — FENTANYL CITRATE (PF) 100 MCG/2ML IJ SOLN
INTRAMUSCULAR | Status: AC
  Administered 2018-04-27: 50 ug via INTRAVENOUS
  Filled 2018-04-27: qty 2

## 2018-04-27 MED ORDER — SEVOFLURANE IN SOLN
RESPIRATORY_TRACT | Status: AC
  Filled 2018-04-27: qty 250

## 2018-04-27 MED ORDER — MELATONIN 5 MG PO TABS
5.0000 mg | ORAL_TABLET | Freq: Once | ORAL | Status: AC
  Administered 2018-04-27: 5 mg via ORAL
  Filled 2018-04-27: qty 1

## 2018-04-27 MED ORDER — KETOROLAC TROMETHAMINE 30 MG/ML IJ SOLN
INTRAMUSCULAR | Status: DC | PRN
  Administered 2018-04-27: 30 mg via INTRAVENOUS

## 2018-04-27 MED ORDER — ACETAMINOPHEN 10 MG/ML IV SOLN
INTRAVENOUS | Status: DC | PRN
  Administered 2018-04-27: 1000 mg via INTRAVENOUS

## 2018-04-27 MED ORDER — SODIUM CHLORIDE FLUSH 0.9 % IV SOLN
INTRAVENOUS | Status: AC
  Filled 2018-04-27: qty 20

## 2018-04-27 MED ORDER — PHENYLEPHRINE HCL 10 MG/ML IJ SOLN
INTRAMUSCULAR | Status: DC | PRN
  Administered 2018-04-27 (×3): 100 ug via INTRAVENOUS
  Administered 2018-04-27: 50 ug via INTRAVENOUS
  Administered 2018-04-27: 100 ug via INTRAVENOUS

## 2018-04-27 MED ORDER — LIDOCAINE HCL (CARDIAC) PF 100 MG/5ML IV SOSY
PREFILLED_SYRINGE | INTRAVENOUS | Status: DC | PRN
  Administered 2018-04-27: 100 mg via INTRAVENOUS

## 2018-04-27 MED ORDER — HYDROMORPHONE HCL 1 MG/ML IJ SOLN
INTRAMUSCULAR | Status: DC | PRN
  Administered 2018-04-27: .3 mg via INTRAVENOUS
  Administered 2018-04-27: .2 mg via INTRAVENOUS
  Administered 2018-04-27: 0.5 mg via INTRAVENOUS

## 2018-04-27 MED ORDER — ONDANSETRON HCL 4 MG/2ML IJ SOLN
INTRAMUSCULAR | Status: AC
  Filled 2018-04-27: qty 2

## 2018-04-27 SURGICAL SUPPLY — 57 items
BLADE SURG 15 STRL LF DISP TIS (BLADE) ×1 IMPLANT
BLADE SURG 15 STRL SS (BLADE) ×2
BLADE SURG SZ11 CARB STEEL (BLADE) ×3 IMPLANT
CANISTER SUCT 1200ML W/VALVE (MISCELLANEOUS) ×3 IMPLANT
CANNULA DILATOR 10 W/SLV (CANNULA) ×2 IMPLANT
CANNULA DILATOR 10MM W/SLV (CANNULA) ×1
CLEANER CAUTERY TIP 5X5 PAD (MISCELLANEOUS) ×1 IMPLANT
COVER WAND RF STERILE (DRAPES) ×3 IMPLANT
CUTTER FLEX LINEAR 45M (STAPLE) IMPLANT
DERMABOND ADVANCED (GAUZE/BANDAGES/DRESSINGS) ×2
DERMABOND ADVANCED .7 DNX12 (GAUZE/BANDAGES/DRESSINGS) ×1 IMPLANT
DRSG OPSITE POSTOP 4X8 (GAUZE/BANDAGES/DRESSINGS) ×3 IMPLANT
DRSG TEGADERM 2-3/8X2-3/4 SM (GAUZE/BANDAGES/DRESSINGS) ×3 IMPLANT
ELECT REM PT RETURN 9FT ADLT (ELECTROSURGICAL) ×3
ELECTRODE REM PT RTRN 9FT ADLT (ELECTROSURGICAL) ×1 IMPLANT
GLOVE BIOGEL PI IND STRL 7.0 (GLOVE) ×1 IMPLANT
GLOVE BIOGEL PI INDICATOR 7.0 (GLOVE) ×2
GLOVE SURG SYN 7.0 (GLOVE) ×3 IMPLANT
GOWN STRL REUS W/ TWL LRG LVL3 (GOWN DISPOSABLE) ×2 IMPLANT
GOWN STRL REUS W/TWL LRG LVL3 (GOWN DISPOSABLE) ×4
GRASPER SUT TROCAR 14GX15 (MISCELLANEOUS) IMPLANT
HANDLE YANKAUER SUCT BULB TIP (MISCELLANEOUS) ×3 IMPLANT
IRRIGATION STRYKERFLOW (MISCELLANEOUS) ×1 IMPLANT
IRRIGATOR STRYKERFLOW (MISCELLANEOUS) ×3
KIT TURNOVER KIT A (KITS) ×3 IMPLANT
LIGASURE LAP MARYLAND 5MM 37CM (ELECTROSURGICAL) IMPLANT
NEEDLE HYPO 22GX1.5 SAFETY (NEEDLE) ×3 IMPLANT
NEEDLE VERESS 14GA 120MM (NEEDLE) IMPLANT
PACK LAP CHOLECYSTECTOMY (MISCELLANEOUS) ×3 IMPLANT
PAD CLEANER CAUTERY TIP 5X5 (MISCELLANEOUS) ×2
POUCH ENDO CATCH 10MM SPEC (MISCELLANEOUS) IMPLANT
RELOAD 45 VASCULAR/THIN (ENDOMECHANICALS) IMPLANT
RELOAD PROXIMATE 75MM BLUE (ENDOMECHANICALS) ×3 IMPLANT
RELOAD STAPLE TA45 3.5 REG BLU (ENDOMECHANICALS) IMPLANT
RETRACTOR WND ALEXIS-O 25 LRG (MISCELLANEOUS) ×1 IMPLANT
RETRACTOR WOUND ALXS 18CM MED (MISCELLANEOUS) ×1 IMPLANT
RTRCTR WOUND ALEXIS O 18CM MED (MISCELLANEOUS) ×3
RTRCTR WOUND ALEXIS O 25CM LRG (MISCELLANEOUS) ×3
SCISSORS METZENBAUM CVD 33 (INSTRUMENTS) ×3 IMPLANT
SPONGE GAUZE 2X2 8PLY STER LF (GAUZE/BANDAGES/DRESSINGS) ×1
SPONGE GAUZE 2X2 8PLY STRL LF (GAUZE/BANDAGES/DRESSINGS) ×2 IMPLANT
STAPLER PROXIMATE 75MM BLUE (STAPLE) ×3 IMPLANT
STAPLER SKIN PROX 35W (STAPLE) ×3 IMPLANT
SUT MNCRL AB 4-0 PS2 18 (SUTURE) ×3 IMPLANT
SUT PDS AB 0 CT1 27 (SUTURE) ×6 IMPLANT
SUT SILK 2 0 (SUTURE) ×2
SUT SILK 2-0 30XBRD TIE 12 (SUTURE) ×1 IMPLANT
SUT SILK 3-0 (SUTURE) ×3 IMPLANT
SUT VIC AB 3-0 SH 27 (SUTURE) ×4
SUT VIC AB 3-0 SH 27X BRD (SUTURE) ×2 IMPLANT
SUT VICRYL PLUS ABS 0 54 (SUTURE) ×3 IMPLANT
SYR 30ML LL (SYRINGE) ×3 IMPLANT
TOWEL OR 17X26 4PK STRL BLUE (TOWEL DISPOSABLE) ×6 IMPLANT
TRAY FOLEY MTR SLVR 16FR STAT (SET/KITS/TRAYS/PACK) ×3 IMPLANT
TROCAR XCEL 12X100 BLDLESS (ENDOMECHANICALS) ×3 IMPLANT
TROCAR XCEL NON-BLD 5MMX100MML (ENDOMECHANICALS) IMPLANT
TUBING INSUFFLATION (TUBING) IMPLANT

## 2018-04-27 NOTE — Anesthesia Preprocedure Evaluation (Signed)
Anesthesia Evaluation  Patient identified by MRN, date of birth, ID band Patient awake    Reviewed: Allergy & Precautions, NPO status , Patient's Chart, lab work & pertinent test results, reviewed documented beta blocker date and time   History of Anesthesia Complications Negative for: history of anesthetic complications  Airway Mallampati: III  TM Distance: >3 FB     Dental  (+) Dental Advidsory Given, Teeth Intact   Pulmonary neg pulmonary ROS, former smoker,           Cardiovascular Exercise Tolerance: Good negative cardio ROS       Neuro/Psych negative neurological ROS  negative psych ROS   GI/Hepatic negative GI ROS, Neg liver ROS,   Endo/Other  negative endocrine ROS  Renal/GU negative Renal ROS     Musculoskeletal   Abdominal   Peds  Hematology   Anesthesia Other Findings Past Medical History: No date: Syncope     Comment:  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   Reproductive/Obstetrics                             Anesthesia Physical  Anesthesia Plan  ASA: I  Anesthesia Plan: General   Post-op Pain Management:    Induction: Intravenous  PONV Risk Score and Plan: 2 and Ondansetron, Dexamethasone, Midazolam, Promethazine and Treatment may vary due to age or medical condition  Airway Management Planned: Oral ETT  Additional Equipment:   Intra-op Plan:   Post-operative Plan: Extubation in OR  Informed Consent: I have reviewed the patients History and Physical, chart, labs and discussed the procedure including the risks, benefits and alternatives for the proposed anesthesia with the patient or authorized representative who has indicated his/her understanding and acceptance.   Dental Advisory Given  Plan Discussed with: CRNA  Anesthesia Plan Comments:         Anesthesia Quick Evaluation

## 2018-04-27 NOTE — Transfer of Care (Signed)
Immediate Anesthesia Transfer of Care Note  Patient: Garrett SiasGraham L Jarrard  Procedure(s) Performed: ATTEMPTED APPENDECTOMY LAPAROSCOPIC CONVERTED TO OPEN (N/A Abdomen) APPENDECTOMY (N/A Abdomen)  Patient Location: PACU  Anesthesia Type:General  Level of Consciousness: drowsy  Airway & Oxygen Therapy: Patient Spontanous Breathing and Patient connected to face mask oxygen  Post-op Assessment: Report given to RN and Post -op Vital signs reviewed and stable  Post vital signs: Reviewed and stable  Last Vitals:  Vitals Value Taken Time  BP 117/63 04/27/2018  6:14 PM  Temp 36.6 C 04/27/2018  6:14 PM  Pulse 91 04/27/2018  6:17 PM  Resp 0 04/27/2018  6:17 PM  SpO2 97 % 04/27/2018  6:17 PM  Vitals shown include unvalidated device data.  Last Pain:  Vitals:   04/27/18 1814  TempSrc:   PainSc: Asleep      Patients Stated Pain Goal: 3 (04/27/18 1118)  Complications: No apparent anesthesia complications

## 2018-04-27 NOTE — Anesthesia Procedure Notes (Signed)
Procedure Name: Intubation Date/Time: 04/27/2018 2:03 PM Performed by: Justus Memory, CRNA Pre-anesthesia Checklist: Patient identified, Patient being monitored, Timeout performed, Emergency Drugs available and Suction available Patient Re-evaluated:Patient Re-evaluated prior to induction Oxygen Delivery Method: Circle system utilized Preoxygenation: Pre-oxygenation with 100% oxygen Induction Type: IV induction Ventilation: Mask ventilation without difficulty Laryngoscope Size: Mac, 3 and 4 Grade View: Grade II Tube type: Oral Tube size: 7.5 mm Number of attempts: 1 Airway Equipment and Method: Stylet Placement Confirmation: ETT inserted through vocal cords under direct vision,  positive ETCO2 and breath sounds checked- equal and bilateral Secured at: 21 cm Tube secured with: Tape Dental Injury: Teeth and Oropharynx as per pre-operative assessment

## 2018-04-27 NOTE — H&P (Signed)
Subjective:   CC: stump appendicitis  HPI:  Garrett Mcpherson is a 37 y.o. male [facility]  [facility].  Symptoms were first noted 2 days ago. Pain is localized to  RLQ and suprapubic region.  Associated with nothing specific, exacerbated by nothing specific    Past Medical History:  has a past medical history of Syncope.  Past Surgical History:  has a past surgical history that includes Colonoscopy (2007); Upper gi endoscopy (2007); and laparoscopic appendectomy (N/A, 12/20/2017).  Family History: family history includes Cancer in his mother.  Social History:  reports that he quit smoking about 14 years ago. He quit after 10.00 years of use. He has never used smokeless tobacco. He reports current alcohol use. He reports that he does not use drugs.  Current Medications:  Medications Prior to Admission  Medication Sig Dispense Refill  . Multiple Vitamin (MULTIVITAMIN) capsule Take 1 capsule by mouth daily.      Allergies:  Allergies as of 04/26/2018  . (No Known Allergies)    ROS:  General: Denies weight loss, weight gain, fatigue, fevers, chills, and night sweats. Eyes: Denies blurry vision, double vision, eye pain, itchy eyes, and tearing. Ears: Denies hearing loss, earache, and ringing in ears. Nose: Denies sinus pain, congestion, infections, runny nose, and nosebleeds. Mouth/throat: Denies hoarseness, sore throat, bleeding gums, and difficulty swallowing. Heart: Denies chest pain, palpitations, racing heart, irregular heartbeat, leg pain or swelling, and decreased activity tolerance. Respiratory: Denies breathing difficulty, shortness of breath, wheezing, cough, and sputum. GI: Denies change in appetite, heartburn, nausea, vomiting, constipation, diarrhea, and blood in stool. GU: Denies difficulty urinating, pain with urinating, urgency, frequency, blood in urine, and heavy menstrual bleeding. Musculoskeletal: Denies joint stiffness, pain,  swelling, muscle weakness, and pain. Skin: Denies rash, itching, mass, tumors, sores, and boils Neurologic: Denies headache, fainting, dizziness, seizures, numbness, and tingling. Psychiatric: Denies depression, anxiety, difficulty sleeping, and memory loss. Endocrine: Denies heat or cold intolerance, and increased thirst or urination. Blood/lymph: Denies easy bruising, easy bruising, and swollen glands    Objective:     BP 110/68 (BP Location: Right Arm)   Pulse 75   Temp 98.2 F (36.8 C) (Oral)   Resp 16   Ht 6\' 2"  (1.88 m)   Wt 109.3 kg   SpO2 96%   BMI 30.94 kg/m    Constitutional :  alert, cooperative, appears stated age and no distress  Lymphatics/Throat:  no asymmetry, masses, or scars  Respiratory:  clear to auscultation bilaterally  Cardiovascular:  regular rate and rhythm  Gastrointestinal: soft, focal tenderness in RLQ, suprapubic region.   Musculoskeletal: Steady gait and movement  Skin: Cool and moist  Psychiatric: Normal affect, non-agitated, not confused       LABS:  CMP Latest Ref Rng & Units 04/27/2018 04/26/2018 12/21/2017  Glucose 70 - 99 mg/dL 045(W112(H) 95 098(J145(H)  BUN 6 - 20 mg/dL 18 14 18   Creatinine 0.61 - 1.24 mg/dL 1.911.24 4.781.21 2.95(A1.36(H)  Sodium 135 - 145 mmol/L 138 137 138  Potassium 3.5 - 5.1 mmol/L 3.7 3.7 4.3  Chloride 98 - 111 mmol/L 103 102 100  CO2 22 - 32 mmol/L 28 27 27   Calcium 8.9 - 10.3 mg/dL 2.1(H8.5(L) 9.2 0.8(M8.4(L)  Total Protein 6.5 - 8.1 g/dL - 7.6 -  Total Bilirubin 0.3 - 1.2 mg/dL - 1.2 -  Alkaline Phos 38 - 126 U/L - 68 -  AST 15 - 41 U/L - 21 -  ALT  0 - 44 U/L - 33 -   CBC Latest Ref Rng & Units 04/27/2018 04/26/2018 12/22/2017  WBC 4.0 - 10.5 K/uL 10.6(H) 14.8(H) 12.4(H)  Hemoglobin 13.0 - 17.0 g/dL 40.9 81.1 91.4  Hematocrit 39.0 - 52.0 % 43.9 47.2 38.2(L)  Platelets 150 - 400 K/uL 146(L) 188 142(L)     RADS: CLINICAL DATA:  Low back pain since 1400 hours yesterday, prior appendectomy 4 months ago  EXAM: CT ABDOMEN AND PELVIS  WITH CONTRAST  TECHNIQUE: Multidetector CT imaging of the abdomen and pelvis was performed using the standard protocol following bolus administration of intravenous contrast. Sagittal and coronal MPR images reconstructed from axial data set.  CONTRAST:  ISOVUE-300 IOPAMIDOL (ISOVUE-300) INJECTION 61% IV. Dilute oral contrast.  COMPARISON:  12/20/2017  FINDINGS: Lower chest: Minimal subsegmental atelectasis LEFT lung base, chronic  Hepatobiliary: Gallbladder and liver normal appearance  Pancreas: Normal appearance  Spleen: Normal appearance  Adrenals/Urinary Tract: Adrenal glands, kidneys, LEFT ureter, and bladder normal appearance. Minimal wall thickening of the mid RIGHT ureter with associated slight hyperemia secondary to adjacent inflammation, see below.  Stomach/Bowel: Inflammatory process identified at the tip of the cecum. Patient is post appendectomy by history. An appendiceal stump is identified with wall thickening and surrounding inflammatory changes consistent with stump appendicitis. The surgical clips are located at the distal aspect of the appendiceal stump. Surrounding infiltrative changes, mid RIGHT ureteral edema, and ill-defined fluid without discrete abscess collection or extraluminal gas.  Food debris in stomach. Stomach and bowel loops otherwise normal appearance.  Vascular/Lymphatic: Vascular structures unremarkable. No adenopathy.  Reproductive: Unremarkable  Other: No free air or free fluid.  No hernia.  Musculoskeletal: Mild degenerative disc disease changes L5-S1. No acute osseous lesions.  IMPRESSION: Prior appendectomy with acute stump appendicitis.  Surrounding inflammatory changes/edema without discrete abscess or extraluminal gas.   Electronically Signed   By: Ulyses Southward M.D.   On: 04/26/2018 17:36 Assessment:      Stump appendicitis  Plan:      Discussed the risk of surgery including post-op  infxn, seroma, hematoma, abscess formation, chronic pain, poor-delayed wound healing, possible bowel resection, possible ostomy, possible conversion to open procedure, post-op SBO or ileus, and need for additional procedures to address said risks.  The risks of general anesthetic including MI, CVA, sudden death or even reaction to anesthetic medications also discussed. Alternatives include continued observation, or antibiotic treatment.  Benefits include possible symptom relief,   Typical post operative recovery of 3-5 days rest, also discussed.  The patient understands the risks, any and all questions were answered to the patient's satisfaction.  Will proceed to OR today, continue NPO, abx in the meantime

## 2018-04-27 NOTE — Anesthesia Post-op Follow-up Note (Signed)
Anesthesia QCDR form completed.        

## 2018-04-27 NOTE — Anesthesia Postprocedure Evaluation (Signed)
Anesthesia Post Note  Patient: Garrett Mcpherson  Procedure(s) Performed: ATTEMPTED APPENDECTOMY LAPAROSCOPIC CONVERTED TO OPEN (N/A Abdomen) APPENDECTOMY (N/A Abdomen)  Patient location during evaluation: PACU Anesthesia Type: General Level of consciousness: awake and alert Pain management: pain level controlled Vital Signs Assessment: post-procedure vital signs reviewed and stable Respiratory status: spontaneous breathing and respiratory function stable Cardiovascular status: stable Anesthetic complications: no     Last Vitals:  Vitals:   04/27/18 1856 04/27/18 1911  BP: 105/63 110/60  Pulse: 95 93  Resp: 14 19  Temp:    SpO2: 94% 94%    Last Pain:  Vitals:   04/27/18 1911  TempSrc:   PainSc: 8                  KEPHART,WILLIAM K

## 2018-04-27 NOTE — Plan of Care (Signed)
  Problem: Pain Managment: Goal: General experience of comfort will improve Outcome: Progressing   Problem: Safety: Goal: Ability to remain free from injury will improve Outcome: Progressing   

## 2018-04-28 ENCOUNTER — Encounter: Payer: Self-pay | Admitting: Surgery

## 2018-04-28 LAB — CBC WITH DIFFERENTIAL/PLATELET
Abs Immature Granulocytes: 0.08 10*3/uL — ABNORMAL HIGH (ref 0.00–0.07)
Abs Immature Granulocytes: 0.06 10*3/uL (ref 0.00–0.07)
Basophils Absolute: 0 10*3/uL (ref 0.0–0.1)
Basophils Absolute: 0 10*3/uL (ref 0.0–0.1)
Basophils Relative: 0 %
Basophils Relative: 0 %
EOS PCT: 0 %
Eosinophils Absolute: 0 10*3/uL (ref 0.0–0.5)
Eosinophils Absolute: 0 10*3/uL (ref 0.0–0.5)
Eosinophils Relative: 0 %
HCT: 42 % (ref 39.0–52.0)
HCT: 43.1 % (ref 39.0–52.0)
Hemoglobin: 13.7 g/dL (ref 13.0–17.0)
Hemoglobin: 14 g/dL (ref 13.0–17.0)
Immature Granulocytes: 1 %
Immature Granulocytes: 1 %
LYMPHS ABS: 2.2 10*3/uL (ref 0.7–4.0)
Lymphocytes Relative: 7 %
Lymphocytes Relative: 17 %
Lymphs Abs: 0.9 10*3/uL (ref 0.7–4.0)
MCH: 29.3 pg (ref 26.0–34.0)
MCH: 28.7 pg (ref 26.0–34.0)
MCHC: 32.6 g/dL (ref 30.0–36.0)
MCHC: 32.5 g/dL (ref 30.0–36.0)
MCV: 89.7 fL (ref 80.0–100.0)
MCV: 88.5 fL (ref 80.0–100.0)
Monocytes Absolute: 0.9 10*3/uL (ref 0.1–1.0)
Monocytes Absolute: 1.1 10*3/uL — ABNORMAL HIGH (ref 0.1–1.0)
Monocytes Relative: 7 %
Monocytes Relative: 9 %
NRBC: 0 % (ref 0.0–0.2)
Neutro Abs: 11.3 10*3/uL — ABNORMAL HIGH (ref 1.7–7.7)
Neutro Abs: 9.8 10*3/uL — ABNORMAL HIGH (ref 1.7–7.7)
Neutrophils Relative %: 85 %
Neutrophils Relative %: 73 %
Platelets: 154 10*3/uL (ref 150–400)
Platelets: 165 10*3/uL (ref 150–400)
RBC: 4.68 MIL/uL (ref 4.22–5.81)
RBC: 4.87 MIL/uL (ref 4.22–5.81)
RDW: 13.2 % (ref 11.5–15.5)
RDW: 13.3 % (ref 11.5–15.5)
WBC: 13.2 10*3/uL — ABNORMAL HIGH (ref 4.0–10.5)
WBC: 13.3 10*3/uL — ABNORMAL HIGH (ref 4.0–10.5)
nRBC: 0 % (ref 0.0–0.2)

## 2018-04-28 LAB — BASIC METABOLIC PANEL
Anion gap: 8 (ref 5–15)
Anion gap: 8 (ref 5–15)
BUN: 12 mg/dL (ref 6–20)
BUN: 12 mg/dL (ref 6–20)
CO2: 24 mmol/L (ref 22–32)
CO2: 27 mmol/L (ref 22–32)
Calcium: 8.1 mg/dL — ABNORMAL LOW (ref 8.9–10.3)
Calcium: 8.5 mg/dL — ABNORMAL LOW (ref 8.9–10.3)
Chloride: 104 mmol/L (ref 98–111)
Chloride: 105 mmol/L (ref 98–111)
Creatinine, Ser: 1.06 mg/dL (ref 0.61–1.24)
Creatinine, Ser: 0.92 mg/dL (ref 0.61–1.24)
GFR calc Af Amer: >60 mL/min (ref >60)
GFR calc non Af Amer: >60 mL/min (ref >60)
Glucose, Bld: 146 mg/dL — ABNORMAL HIGH (ref 70–99)
Glucose, Bld: 107 mg/dL — ABNORMAL HIGH (ref 70–99)
Potassium: 4.5 mmol/L (ref 3.5–5.1)
Potassium: 4.1 mmol/L (ref 3.5–5.1)
SODIUM: 136 mmol/L (ref 135–145)
Sodium: 140 mmol/L (ref 135–145)

## 2018-04-28 LAB — PHOSPHORUS: Phosphorus: 3.6 mg/dL (ref 2.5–4.6)

## 2018-04-28 LAB — MAGNESIUM: Magnesium: 2 mg/dL (ref 1.7–2.4)

## 2018-04-28 MED ORDER — ZOLPIDEM TARTRATE 5 MG PO TABS
5.0000 mg | ORAL_TABLET | Freq: Every evening | ORAL | Status: DC | PRN
  Administered 2018-04-28: 5 mg via ORAL
  Filled 2018-04-28: qty 1

## 2018-04-28 MED ORDER — CELECOXIB 200 MG PO CAPS
200.0000 mg | ORAL_CAPSULE | Freq: Every day | ORAL | Status: DC
  Administered 2018-04-28 – 2018-04-29 (×2): 200 mg via ORAL
  Filled 2018-04-28 (×2): qty 1

## 2018-04-28 MED ORDER — METAXALONE 800 MG PO TABS
400.0000 mg | ORAL_TABLET | Freq: Three times a day (TID) | ORAL | Status: DC | PRN
  Administered 2018-04-28: 400 mg via ORAL
  Filled 2018-04-28 (×2): qty 0.5

## 2018-04-28 MED ORDER — HYDROMORPHONE HCL 1 MG/ML IJ SOLN
0.5000 mg | INTRAMUSCULAR | Status: DC | PRN
  Administered 2018-04-28 (×2): 0.5 mg via INTRAVENOUS
  Filled 2018-04-28 (×2): qty 0.5

## 2018-04-28 NOTE — Progress Notes (Signed)
Subjective:  CC:  Garrett Mcpherson is a 37 y.o. male  Hospital stay day 2, 1 Day Post-Op lap converted to open ileocectomy for stump appendicitis   HPI: Pain has been bad today, mostly localized to incision area.  No flatus, or BM.  Morphine and percocet not enough.  Tolerating water.    Pt states he had a coughing fit and though he heard a pop.  The pain level did not significantly change afterwards.  Foley out but had issues with removal.  Denies hematuria.  ROS:  General: Denies weight loss, weight gain, fatigue, fevers, chills, and night sweats. Heart: Denies chest pain, palpitations, racing heart, irregular heartbeat, leg pain or swelling, and decreased activity tolerance. Respiratory: Denies breathing difficulty, shortness of breath, wheezing, cough, and sputum. GI: Denies change in appetite, heartburn, nausea, vomiting, constipation, diarrhea, and blood in stool. GU: Denies difficulty urinating, pain with urinating, urgency, frequency, blood in urine, and heavy menstrual bleeding.   Objective:      Temp:  [97.4 F (36.3 C)-97.9 F (36.6 C)] 97.4 F (36.3 C) (12/18 1203) Pulse Rate:  [73-99] 73 (12/18 1203) Resp:  [12-28] 20 (12/18 1203) BP: (103-117)/(60-74) 117/74 (12/18 1203) SpO2:  [92 %-99 %] 99 % (12/18 1203)     Height: 6\' 2"  (188 cm) Weight: 109.3 kg BMI (Calculated): 30.93   Intake/Output this shift:   Intake/Output Summary (Last 24 hours) at 04/28/2018 1435 Last data filed at 04/28/2018 1304 Gross per 24 hour  Intake 4106.57 ml  Output 3040 ml  Net 1066.57 ml        Constitutional :  alert, cooperative, appears stated age and no distress  Respiratory:  clear to auscultation bilaterally  Cardiovascular:  regular rate and rhythm  Gastrointestinal: tenderness along midline incision extending toward lateral rectus edge with voluntary guarding, but no rebound tenderness or generalized peritonitis.  staples intact, no obvious fluid collection or dehiscence  noted..   Skin: Cool and moist.   Psychiatric: Normal affect, non-agitated, not confused       LABS:  CMP Latest Ref Rng & Units 04/28/2018 04/27/2018 04/26/2018  Glucose 70 - 99 mg/dL 045(W146(H) 098(J112(H) 95  BUN 6 - 20 mg/dL 12 18 14   Creatinine 0.61 - 1.24 mg/dL 1.911.06 4.781.24 2.951.21  Sodium 135 - 145 mmol/L 136 138 137  Potassium 3.5 - 5.1 mmol/L 4.5 3.7 3.7  Chloride 98 - 111 mmol/L 104 103 102  CO2 22 - 32 mmol/L 24 28 27   Calcium 8.9 - 10.3 mg/dL 8.1(L) 8.5(L) 9.2  Total Protein 6.5 - 8.1 g/dL - - 7.6  Total Bilirubin 0.3 - 1.2 mg/dL - - 1.2  Alkaline Phos 38 - 126 U/L - - 68  AST 15 - 41 U/L - - 21  ALT 0 - 44 U/L - - 33   CBC Latest Ref Rng & Units 04/28/2018 04/27/2018 04/26/2018  WBC 4.0 - 10.5 K/uL 13.2(H) 10.6(H) 14.8(H)  Hemoglobin 13.0 - 17.0 g/dL 62.113.7 30.814.3 65.715.6  Hematocrit 39.0 - 52.0 % 42.0 43.9 47.2  Platelets 150 - 400 K/uL 154 146(L) 188    RADS: n/a Assessment:   lap converted to open ileocectomy for stump appendicitis.  Pain control with dilaudid, skelaxin, celebrex for now.  HR normal, and otherwise looks healthy, so doubt any intra-abdominal issues but will continue to monitor closely.  Keep NPO for now.  Midline wound also looks intact, does not feel like any dehescence but will continue to monitor also.  Repeat abd exams and labs

## 2018-04-28 NOTE — Progress Notes (Addendum)
Per MD okay for RN to DC foley catheter. Also order 0.5 mg of dilaudid Q3 for severe pain.

## 2018-04-28 NOTE — Op Note (Signed)
Preoperative diagnosis: stump appendicitis.  Postoperative diagnosis: same Procedure: Laparoscopic converted to open ileocecectomy  Anesthesia: GETA  Surgeon: Sung AmabileIsami Chrisann Melaragno Assistant: Cintron-Diaz  Wound Classification: clean contaminated  Specimen: ileum and Cecum  Complications: None  Estimated Blood Loss: 30 mL   Indications: Patient is a 37 y.o. male  presented with right lower quadrant pain.  Computed tomography scan and physical examination were consistent with stump appendicitis.   Findings: 1.pericecal phelgmon and extensive inflammation of cecum, unable to dissect away appendix, therfore converted to ileocecectomy.   Description of procedure: The patient was placed on the operating table in the supine position, left arm tucked. General anesthesia was induced. A time-out was completed verifying correct patient, procedure, site, positioning, and implant(s) and/or special equipment prior to beginning this procedure. A Foley catheter placed. The abdomen was prepped and draped in the usual sterile fashion.   After local was infused, an incision was made inferior to the umbilicus.  11 blade was then used to make an midline incision down to the fascia and veress needle inserted. Saline drop test positive.  Insufflation started   with carbon dioxide to a pressure of 15 mmHg. The patient tolerated insufflation well. Needle removed and 5mm port placed via optiview technique. The laparoscope was inserted and the abdomen inspected. No injuries from initial trocar placement were noted. Another 5mm port and 11-mm port was then placed above the symphysis pubis on midline and LLQ.  Care was taken to avoid injury to the bladder or inferior epigastric vessels. The table was placed in the Trendelenburg position with the right side elevated.  An inflamed cecum was identified and elevated.  Attempt was made to dissect and isolate out the stump appendix but extensive inflammation and phelgmon formation  at the cecum made it impossible.  An abscess pocket was also noted and drained completely.  Therefore, decision was made to convert to a open ileocecectomy.  The two midline incisions were connected into one larger incision.  The cecum was dissected free from surrounding attachments.  Ascending colon then divided using GIA blue load.  The mesentary was then divided using ligasure and suture ligation of the palpable vessels with 3-0 silk.   The ileum was divided at least 15cm from the ileocolic valve and using a GIA device. The mesenteric dissection was completed as needed on the small bowel and colonic sides and the specimen resection was thus completed. The specimen was removed.  Examination of specimen did reveal the appendiceal stump densely adhered to the posterior aspect of the cecum.  The two ends of the ileum and ascendingcolon were then aligned, ensuring that the mesentery was not twisted, and a stapled end-to-end anastomosis was created in the usual manner using 75mm stapler.  The new staple line anastamosis was lembert sutured. The mesenteric defect was then closed with Vicryl 2-0 sutures and the bowel reintroduced into the peritoneal cavity.  No bleeding or injury noted.  Fascia was closed with running PDS 0 x2.   Exparel infused to incisions prior to closing.  3-0 vicryl used to close subcutaneous layer prior to closing all skin incisions with stapler.  Wounds then dressed with honeycomb sponge, 2x2, and tegarderm.    The sponge and instrument count correct and the patient tolerated the procedure well and was transferred to the post-anesthesia care unit in stable condition.  Foley placed preop was kept in place. Sponge count and instrument count correct at the end of the procedure.

## 2018-04-29 LAB — PHOSPHORUS: Phosphorus: 3 mg/dL (ref 2.5–4.6)

## 2018-04-29 LAB — CBC WITH DIFFERENTIAL/PLATELET
Abs Immature Granulocytes: 0.04 10*3/uL (ref 0.00–0.07)
Basophils Absolute: 0 10*3/uL (ref 0.0–0.1)
Basophils Relative: 0 %
Eosinophils Absolute: 0.1 10*3/uL (ref 0.0–0.5)
Eosinophils Relative: 1 %
HCT: 40.2 % (ref 39.0–52.0)
Hemoglobin: 12.8 g/dL — ABNORMAL LOW (ref 13.0–17.0)
Immature Granulocytes: 0 %
LYMPHS ABS: 2.7 10*3/uL (ref 0.7–4.0)
Lymphocytes Relative: 28 %
MCH: 28.8 pg (ref 26.0–34.0)
MCHC: 31.8 g/dL (ref 30.0–36.0)
MCV: 90.3 fL (ref 80.0–100.0)
MONO ABS: 1 10*3/uL (ref 0.1–1.0)
MONOS PCT: 10 %
Neutro Abs: 5.8 10*3/uL (ref 1.7–7.7)
Neutrophils Relative %: 61 %
Platelets: 169 10*3/uL (ref 150–400)
RBC: 4.45 MIL/uL (ref 4.22–5.81)
RDW: 13.3 % (ref 11.5–15.5)
WBC: 9.6 10*3/uL (ref 4.0–10.5)
nRBC: 0 % (ref 0.0–0.2)

## 2018-04-29 LAB — BASIC METABOLIC PANEL
Anion gap: 5 (ref 5–15)
BUN: 14 mg/dL (ref 6–20)
CHLORIDE: 107 mmol/L (ref 98–111)
CO2: 27 mmol/L (ref 22–32)
Calcium: 8 mg/dL — ABNORMAL LOW (ref 8.9–10.3)
Creatinine, Ser: 1.07 mg/dL (ref 0.61–1.24)
GFR calc Af Amer: >60 mL/min (ref >60)
GFR calc non Af Amer: >60 mL/min (ref >60)
Glucose, Bld: 101 mg/dL — ABNORMAL HIGH (ref 70–99)
Potassium: 4.3 mmol/L (ref 3.5–5.1)
Sodium: 139 mmol/L (ref 135–145)

## 2018-04-29 LAB — MAGNESIUM: Magnesium: 2.1 mg/dL (ref 1.7–2.4)

## 2018-04-29 MED ORDER — ACETAMINOPHEN 325 MG PO TABS: 650.0000 mg | ORAL_TABLET | Freq: Three times a day (TID) | ORAL | 0 refills | Status: AC | PRN

## 2018-04-29 MED ORDER — IBUPROFEN 800 MG PO TABS: 800.0000 mg | ORAL_TABLET | Freq: Three times a day (TID) | ORAL | 0 refills | Status: DC | PRN

## 2018-04-29 MED ORDER — DOCUSATE SODIUM 100 MG PO CAPS: 100.0000 mg | ORAL_CAPSULE | Freq: Two times a day (BID) | ORAL | 0 refills | Status: AC | PRN

## 2018-04-29 MED ORDER — METAXALONE 400 MG PO TABS: 400.0000 mg | ORAL_TABLET | Freq: Three times a day (TID) | ORAL | 0 refills | Status: AC | PRN

## 2018-04-29 MED ORDER — OXYCODONE-ACETAMINOPHEN 5-325 MG PO TABS: 1.0000 | ORAL_TABLET | ORAL | 0 refills | Status: DC | PRN

## 2018-04-29 NOTE — Progress Notes (Signed)
Ezrah L Zuk  A and O x 4. VSS. Pt tolerating diet well. No complaints of pain or nausea. IV removed intact, prescriptions given. Pt voiced understanding of discharge instructions with no further questions. Pt discharged via wheelchair with axillary.    Allergies as of 04/29/2018   No Known Allergies     Medication List    TAKE these medications   acetaminophen 325 MG tablet Commonly known as:  TYLENOL Take 2 tablets (650 mg total) by mouth every 8 (eight) hours as needed for mild pain.   docusate sodium 100 MG capsule Commonly known as:  COLACE Take 1 capsule (100 mg total) by mouth 2 (two) times daily as needed for up to 10 days for mild constipation.   ibuprofen 800 MG tablet Commonly known as:  ADVIL,MOTRIN Take 1 tablet (800 mg total) by mouth every 8 (eight) hours as needed for mild pain or moderate pain.   metaxalone 400 MG tablet Commonly known as:  SKELAXIN Take 1 tablet (400 mg total) by mouth 3 (three) times daily as needed for up to 5 days for muscle spasms.   multivitamin capsule Take 1 capsule by mouth daily.   oxyCODONE-acetaminophen 5-325 MG tablet Commonly known as:  PERCOCET/ROXICET Take 1-2 tablets by mouth every 4 (four) hours as needed for moderate pain.       Vitals:   04/29/18 0544 04/29/18 1219  BP: (!) 103/56 122/74  Pulse: 70 66  Resp: 18 20  Temp: 97.6 F (36.4 C) 98 F (36.7 C)  SpO2: 97% 100%    Suzzanne CloudJuan G Rodriguez Ornelas

## 2018-04-29 NOTE — Discharge Instructions (Signed)
Ileocecectomy, Adult, Care After This sheet gives you information about how to care for yourself after your procedure. Your health care provider may also give you more specific instructions. If you have problems or questions, contact your health care provider. What can I expect after the procedure? After the procedure, it is common to have:  Little energy for normal activities.  Mild pain in the area where the incisions were made.  Difficulty passing stool (constipation). This can be caused by: ? Pain medicine. ? A decrease in your activity. Follow these instructions at home: Medicines  tylenol and advil as needed for discomfort.  Please alternate between the two every four hours as needed for pain.    Use narcotics or the muscle relaxer, if prescribed, only when tylenol and motrin is not enough to control pain.  325-650mg  every 8hrs to max of 4000mg /24hrs (including the 325mg  in every norco dose) for the tylenol.    Advil up to 800mg  per dose every 8hrs as needed for pain.    If you were prescribed an antibiotic medicine, take it as told by your health care provider. Do not stop taking the antibiotic even if you start to feel better.  Do not drive or use heavy machinery while taking prescription pain medicine.  Ask your health care provider if the medicine prescribed to you can cause constipation. You may need to take steps to prevent or treat constipation, such as: ? Drink enough fluid to keep your urine pale yellow. ? Take over-the-counter or prescription medicines. ? Eat foods that are high in fiber, such as beans, whole grains, and fresh fruits and vegetables. ? Limit foods that are high in fat and processed sugars, such as fried or sweet foods. Incision care   Follow instructions from your health care provider about how to take care of your incisions. Make sure you: ? Wash your hands with soap and water before and after you change your bandage (dressing). If soap and water are  not available, use hand sanitizer. ? REMOVE dressing on Saturday. OK TO SHOWER TODAY ? Leave stitches (sutures), skin glue, or adhesive strips in place. These skin closures may need to stay in place for 2 weeks or longer. If adhesive strip edges start to loosen and curl up, you may trim the loose edges. Do not remove adhesive strips completely unless your health care provider tells you to do that.  Check your incision areas every day for signs of infection. Check for: ? Redness, swelling, or pain. ? Fluid or blood. ? Warmth. ? Pus or a bad smell. Bathing  Keep your incisions clean and dry. Clean them as often as told by your health care provider. To do this: 1. Gently wash the incisions with soap and water. 2. Rinse the incisions with water to remove all soap. 3. Pat the incisions dry with a clean towel. Do not rub the incisions.  Do not take baths, swim, or use a hot tub for 2 weeks, or until your health care provider approves. OK TO SHOWER IN 24hrs Activity   Do not drive for 24 hours if you were given a sedative during your procedure.  Rest after the procedure. Return to your normal activities as told by your health care provider. Ask your health care provider what activities are safe for you.  For 3 weeks, or for as long as told by your health care provider: ? Do not lift anything that is heavier than 10 lb (4.5 kg), or the limit  that you are told. ? Do not play contact sports. General instructions  If you were sent home with a drain, follow instructions from your health care provider about how to care for it.  Take deep breaths. This helps to prevent your lungs from developing an infection (pneumonia).  Keep all follow-up visits as told by your health care provider. This is important. Contact a health care provider if:  You have redness, swelling, or pain around an incision.  You have fluid or blood coming from an incision.  Your incision feels warm to the touch.  You  have pus or a bad smell coming from an incision or dressing.  Your incision edges break open after your sutures have been removed.  You have increasing pain in your shoulders.  You feel dizzy or you faint.  You develop shortness of breath.  You keep feeling nauseous or you are vomiting.  You have diarrhea or you cannot control your bowel functions.  You lose your appetite.  You develop swelling or pain in your legs.  You develop a rash. Get help right away if you have:  A fever.  Difficulty breathing.  Sharp pains in your chest. Summary  After a laparoscopic appendectomy, it is common to have little energy for normal activities, mild pain in the area of the incisions, and constipation.  Infection is the most common complication after this procedure. Follow your health care provider's instructions about caring for yourself after the procedure.  Rest after the procedure. Return to your normal activities as told by your health care provider.  Contact your health care provider if you notice signs of infection around your incisions or you develop shortness of breath. Get help right away if you have a fever, chest pain, or difficulty breathing. This information is not intended to replace advice given to you by your health care provider. Make sure you discuss any questions you have with your health care provider. Document Released: 04/28/2005 Document Revised: 10/29/2017 Document Reviewed: 10/29/2017 Elsevier Interactive Patient Education  2019 [employer]

## 2018-04-30 ENCOUNTER — Inpatient Hospital Stay: Payer: BLUE CROSS/BLUE SHIELD

## 2018-04-30 ENCOUNTER — Inpatient Hospital Stay
Admission: EM | Admit: 2018-04-30 | Discharge: 2018-05-02 | Disposition: A | Discharge status: Home / Self Care | Payer: BLUE CROSS/BLUE SHIELD | Source: Home / Self Care | Attending: Surgery | Admitting: Surgery

## 2018-04-30 ENCOUNTER — Emergency Department: Payer: BLUE CROSS/BLUE SHIELD

## 2018-04-30 ENCOUNTER — Other Ambulatory Visit: Payer: Self-pay

## 2018-04-30 DIAGNOSIS — R112 Nausea with vomiting, unspecified: Secondary | ICD-10-CM

## 2018-04-30 DIAGNOSIS — R110 Nausea: Secondary | ICD-10-CM

## 2018-04-30 DIAGNOSIS — Z0189 Encounter for other specified special examinations: Secondary | ICD-10-CM

## 2018-04-30 DIAGNOSIS — K567 Ileus, unspecified: Secondary | ICD-10-CM | POA: Diagnosis present

## 2018-04-30 DIAGNOSIS — G8918 Other acute postprocedural pain: Secondary | ICD-10-CM

## 2018-04-30 DIAGNOSIS — R1084 Generalized abdominal pain: Secondary | ICD-10-CM

## 2018-04-30 DIAGNOSIS — K9189 Other postprocedural complications and disorders of digestive system: Secondary | ICD-10-CM

## 2018-04-30 LAB — CBC WITH DIFFERENTIAL/PLATELET
Abs Immature Granulocytes: 0.05 10*3/uL (ref 0.00–0.07)
BASOS PCT: 0 %
Basophils Absolute: 0 10*3/uL (ref 0.0–0.1)
Eosinophils Absolute: 0.1 10*3/uL (ref 0.0–0.5)
Eosinophils Relative: 1 %
HCT: 48.4 % (ref 39.0–52.0)
Hemoglobin: 15.8 g/dL (ref 13.0–17.0)
Immature Granulocytes: 0 %
Lymphocytes Relative: 22 %
Lymphs Abs: 2.5 10*3/uL (ref 0.7–4.0)
MCH: 28.9 pg (ref 26.0–34.0)
MCHC: 32.6 g/dL (ref 30.0–36.0)
MCV: 88.6 fL (ref 80.0–100.0)
Monocytes Absolute: 1.2 10*3/uL — ABNORMAL HIGH (ref 0.1–1.0)
Monocytes Relative: 11 %
Neutro Abs: 7.4 10*3/uL (ref 1.7–7.7)
Neutrophils Relative %: 66 %
PLATELETS: 229 10*3/uL (ref 150–400)
RBC: 5.46 MIL/uL (ref 4.22–5.81)
RDW: 13.2 % (ref 11.5–15.5)
WBC: 11.2 10*3/uL — ABNORMAL HIGH (ref 4.0–10.5)
nRBC: 0 % (ref 0.0–0.2)

## 2018-04-30 LAB — CBC
HCT: 42.3 % (ref 39.0–52.0)
Hemoglobin: 13.7 g/dL (ref 13.0–17.0)
MCH: 28.5 pg (ref 26.0–34.0)
MCHC: 32.4 g/dL (ref 30.0–36.0)
MCV: 87.9 fL (ref 80.0–100.0)
PLATELETS: 204 10*3/uL (ref 150–400)
RBC: 4.81 MIL/uL (ref 4.22–5.81)
RDW: 13.2 % (ref 11.5–15.5)
WBC: 9.2 10*3/uL (ref 4.0–10.5)
nRBC: 0 % (ref 0.0–0.2)

## 2018-04-30 LAB — COMPREHENSIVE METABOLIC PANEL
ALT: 21 U/L (ref 0–44)
ALT: 18 U/L (ref 0–44)
AST: 19 U/L (ref 15–41)
AST: 17 U/L (ref 15–41)
Albumin: 4.2 g/dL (ref 3.5–5.0)
Albumin: 3.3 g/dL — ABNORMAL LOW (ref 3.5–5.0)
Alkaline Phosphatase: 60 U/L (ref 38–126)
Alkaline Phosphatase: 49 U/L (ref 38–126)
Anion gap: 11 (ref 5–15)
Anion gap: 8 (ref 5–15)
BUN: 16 mg/dL (ref 6–20)
BUN: 16 mg/dL (ref 6–20)
CHLORIDE: 105 mmol/L (ref 98–111)
CO2: 27 mmol/L (ref 22–32)
CO2: 27 mmol/L (ref 22–32)
CREATININE: 0.92 mg/dL (ref 0.61–1.24)
Calcium: 9.4 mg/dL (ref 8.9–10.3)
Calcium: 8.5 mg/dL — ABNORMAL LOW (ref 8.9–10.3)
Chloride: 102 mmol/L (ref 98–111)
Creatinine, Ser: 1.2 mg/dL (ref 0.61–1.24)
GFR calc Af Amer: >60 mL/min (ref >60)
GFR calc Af Amer: >60 mL/min (ref >60)
GFR calc non Af Amer: >60 mL/min (ref >60)
GFR calc non Af Amer: >60 mL/min (ref >60)
Glucose, Bld: 108 mg/dL — ABNORMAL HIGH (ref 70–99)
Glucose, Bld: 113 mg/dL — ABNORMAL HIGH (ref 70–99)
Potassium: 3.8 mmol/L (ref 3.5–5.1)
Potassium: 3.9 mmol/L (ref 3.5–5.1)
Sodium: 140 mmol/L (ref 135–145)
Sodium: 140 mmol/L (ref 135–145)
Total Bilirubin: 0.7 mg/dL (ref 0.3–1.2)
Total Bilirubin: 0.4 mg/dL (ref 0.3–1.2)
Total Protein: 7.3 g/dL (ref 6.5–8.1)
Total Protein: 6.2 g/dL — ABNORMAL LOW (ref 6.5–8.1)

## 2018-04-30 LAB — LACTIC ACID, PLASMA: Lactic Acid, Venous: 0.9 mmol/L (ref 0.5–1.9)

## 2018-04-30 LAB — PHOSPHORUS: Phosphorus: 4.3 mg/dL (ref 2.5–4.6)

## 2018-04-30 LAB — MAGNESIUM: Magnesium: 1.9 mg/dL (ref 1.7–2.4)

## 2018-04-30 LAB — SURGICAL PATHOLOGY

## 2018-04-30 LAB — LIPASE, BLOOD: Lipase: 28 U/L (ref 11–51)

## 2018-04-30 MED ORDER — ONDANSETRON HCL 4 MG/2ML IJ SOLN
4.0000 mg | Freq: Once | INTRAMUSCULAR | Status: AC | PRN
  Administered 2018-04-30: 4 mg via INTRAVENOUS
  Filled 2018-04-30: qty 2

## 2018-04-30 MED ORDER — MORPHINE SULFATE (PF) 4 MG/ML IV SOLN: 4.0000 mg | Freq: Once | INTRAVENOUS | Status: DC

## 2018-04-30 MED ORDER — SODIUM CHLORIDE 0.9 % IV BOLUS
1000.0000 mL | Freq: Once | INTRAVENOUS | Status: AC
  Administered 2018-04-30: 1000 mL via INTRAVENOUS

## 2018-04-30 MED ORDER — METAXALONE 800 MG PO TABS
400.0000 mg | ORAL_TABLET | Freq: Three times a day (TID) | ORAL | Status: DC | PRN
  Administered 2018-04-30: 400 mg via ORAL
  Filled 2018-04-30 (×3): qty 0.5

## 2018-04-30 MED ORDER — ONDANSETRON HCL 4 MG/2ML IJ SOLN
INTRAMUSCULAR | Status: AC
  Filled 2018-04-30: qty 2

## 2018-04-30 MED ORDER — PROMETHAZINE HCL 25 MG/ML IJ SOLN: 25.0000 mg | Freq: Once | INTRAMUSCULAR | Status: DC

## 2018-04-30 MED ORDER — ONDANSETRON 4 MG PO TBDP: 4.0000 mg | ORAL_TABLET | Freq: Four times a day (QID) | ORAL | Status: DC | PRN

## 2018-04-30 MED ORDER — DEXTROSE IN LACTATED RINGERS 5 % IV SOLN
INTRAVENOUS | Status: DC
  Administered 2018-04-30 – 2018-05-01 (×3): via INTRAVENOUS

## 2018-04-30 MED ORDER — HYDRALAZINE HCL 20 MG/ML IJ SOLN: 10.0000 mg | INTRAMUSCULAR | Status: DC | PRN

## 2018-04-30 MED ORDER — GABAPENTIN 100 MG PO CAPS
200.0000 mg | ORAL_CAPSULE | Freq: Two times a day (BID) | ORAL | Status: DC
  Administered 2018-04-30: 200 mg via ORAL
  Filled 2018-04-30: qty 2

## 2018-04-30 MED ORDER — KETOROLAC TROMETHAMINE 30 MG/ML IJ SOLN: 30.0000 mg | Freq: Four times a day (QID) | INTRAMUSCULAR | Status: DC | PRN

## 2018-04-30 MED ORDER — HYDROMORPHONE HCL 1 MG/ML IJ SOLN: 0.5000 mg | INTRAMUSCULAR | Status: DC | PRN

## 2018-04-30 MED ORDER — KETOROLAC TROMETHAMINE 30 MG/ML IJ SOLN
30.0000 mg | Freq: Four times a day (QID) | INTRAMUSCULAR | Status: DC | PRN
  Administered 2018-04-30 – 2018-05-02 (×3): 30 mg via INTRAVENOUS
  Filled 2018-04-30 (×3): qty 1

## 2018-04-30 MED ORDER — PROCHLORPERAZINE MALEATE 10 MG PO TABS
10.0000 mg | ORAL_TABLET | Freq: Four times a day (QID) | ORAL | Status: DC | PRN
  Filled 2018-04-30: qty 1

## 2018-04-30 MED ORDER — ONDANSETRON HCL 4 MG/2ML IJ SOLN: 4.0000 mg | INTRAMUSCULAR | Status: AC

## 2018-04-30 MED ORDER — OXYCODONE-ACETAMINOPHEN 5-325 MG PO TABS: 1.0000 | ORAL_TABLET | ORAL | Status: DC | PRN

## 2018-04-30 MED ORDER — PANTOPRAZOLE SODIUM 40 MG IV SOLR
40.0000 mg | Freq: Every day | INTRAVENOUS | Status: DC
  Administered 2018-04-30 – 2018-05-01 (×2): 40 mg via INTRAVENOUS
  Filled 2018-04-30 (×2): qty 40

## 2018-04-30 MED ORDER — CELECOXIB 200 MG PO CAPS
200.0000 mg | ORAL_CAPSULE | Freq: Every day | ORAL | Status: DC
  Administered 2018-04-30: 200 mg via ORAL
  Filled 2018-04-30: qty 1

## 2018-04-30 MED ORDER — MELATONIN 5 MG PO TABS
5.0000 mg | ORAL_TABLET | Freq: Every day | ORAL | Status: DC
  Administered 2018-05-01 (×2): 5 mg via ORAL
  Filled 2018-04-30 (×3): qty 1

## 2018-04-30 MED ORDER — DOCUSATE SODIUM 100 MG PO CAPS: 100.0000 mg | ORAL_CAPSULE | Freq: Every day | ORAL | Status: DC | PRN

## 2018-04-30 MED ORDER — HYDROMORPHONE HCL 1 MG/ML IJ SOLN
INTRAMUSCULAR | Status: AC
  Filled 2018-04-30: qty 1

## 2018-04-30 MED ORDER — PROCHLORPERAZINE EDISYLATE 10 MG/2ML IJ SOLN
5.0000 mg | Freq: Four times a day (QID) | INTRAMUSCULAR | Status: DC | PRN
  Filled 2018-04-30: qty 2

## 2018-04-30 MED ORDER — HYDROMORPHONE HCL 1 MG/ML IJ SOLN
1.0000 mg | INTRAMUSCULAR | Status: AC
  Administered 2018-04-30: 1 mg via INTRAVENOUS

## 2018-04-30 MED ORDER — ONDANSETRON HCL 4 MG/2ML IJ SOLN
4.0000 mg | Freq: Four times a day (QID) | INTRAMUSCULAR | Status: DC | PRN
  Administered 2018-05-01: 4 mg via INTRAVENOUS
  Filled 2018-04-30: qty 2

## 2018-04-30 MED ORDER — ENOXAPARIN SODIUM 40 MG/0.4ML ~~LOC~~ SOLN
40.0000 mg | SUBCUTANEOUS | Status: DC
  Filled 2018-04-30: qty 0.4

## 2018-04-30 MED ORDER — PHENOL 1.4 % MT LIQD
1.0000 | OROMUCOSAL | Status: DC | PRN
  Administered 2018-04-30: 1 via OROMUCOSAL
  Filled 2018-04-30 (×2): qty 177

## 2018-04-30 MED ORDER — LORAZEPAM 2 MG/ML IJ SOLN
0.5000 mg | Freq: Four times a day (QID) | INTRAMUSCULAR | Status: DC | PRN
  Administered 2018-04-30: 0.5 mg via INTRAVENOUS
  Filled 2018-04-30: qty 1

## 2018-04-30 MED ORDER — HYDROCORTISONE 2.5 % RE CREA
TOPICAL_CREAM | Freq: Two times a day (BID) | RECTAL | Status: DC
  Administered 2018-04-30 – 2018-05-01 (×2): via RECTAL
  Filled 2018-04-30 (×2): qty 28.35

## 2018-04-30 MED ORDER — IOPAMIDOL (ISOVUE-300) INJECTION 61%
100.0000 mL | Freq: Once | INTRAVENOUS | Status: AC | PRN
  Administered 2018-04-30: 100 mL via INTRAVENOUS

## 2018-04-30 NOTE — ED Triage Notes (Signed)
Patient reports colon resection on Tuesday. Patient c/o fever and hematemesis. Patient reports taking "large dose" ibuprofen within the last 8 hours.

## 2018-04-30 NOTE — H&P (Signed)
Subjective:  CC:  Garrett Mcpherson is a 37 y.o. male  Hospital stay day 0,   lap converted to open ileocectomy for stump appendicitis, readmitted for vomiting, mild ileus on CT scan.   HPI: Pain has been better since readmission.  Returned to ED due to nausea and emesis x1, with some streaks of blood.   ROS:  General: Denies weight loss, weight gain, fatigue, fevers, chills, and night sweats. Heart: Denies chest pain, palpitations, racing heart, irregular heartbeat, leg pain or swelling, and decreased activity tolerance. Respiratory: Denies breathing difficulty, shortness of breath, wheezing, cough, and sputum. GI: Denies change in appetite, heartburn, nausea, vomiting, constipation, diarrhea, and blood in stool. GU: Denies difficulty urinating, pain with urinating, urgency, frequency, blood in urine, and heavy menstrual bleeding.   Objective:      Temp:  [97.6 F (36.4 C)-98 F (36.7 C)] 98 F (36.7 C) (12/20 0558) Pulse Rate:  [66-104] 68 (12/20 0558) Resp:  [11-20] 20 (12/20 0558) BP: (122-141)/(74-83) 124/74 (12/20 0558) SpO2:  [91 %-100 %] 98 % (12/20 0508) Weight:  [108.9 kg] 108.9 kg (12/20 0558)     Height: 6\' 2"  (188 cm) Weight: 108.9 kg BMI (Calculated): 30.8   Intake/Output this shift:   Intake/Output Summary (Last 24 hours) at 04/30/2018 0945 Last data filed at 04/30/2018 0600 Gross per 24 hour  Intake 91.52 ml  Output -  Net 91.52 ml        Constitutional :  alert, cooperative, appears stated age and no distress  Respiratory:  clear to auscultation bilaterally  Cardiovascular:  regular rate and rhythm  Gastrointestinal: tenderness along midline incision extending toward lateral rectus edge with voluntary guarding, but no rebound tenderness or generalized peritonitis.  staples intact, no obvious fluid collection or dehiscence noted..   Skin: Cool and moist.   Psychiatric: Normal affect, non-agitated, not confused       LABS:  CMP Latest Ref Rng &  Units 04/30/2018 04/30/2018 04/29/2018  Glucose 70 - 99 mg/dL 098(J113(H) 191(Y108(H) 782(N101(H)  BUN 6 - 20 mg/dL 16 16 14   Creatinine 0.61 - 1.24 mg/dL 5.620.92 1.301.20 8.651.07  Sodium 135 - 145 mmol/L 140 140 139  Potassium 3.5 - 5.1 mmol/L 3.9 3.8 4.3  Chloride 98 - 111 mmol/L 105 102 107  CO2 22 - 32 mmol/L 27 27 27   Calcium 8.9 - 10.3 mg/dL 7.8(I8.5(L) 9.4 8.0(L)  Total Protein 6.5 - 8.1 g/dL 6.2(L) 7.3 -  Total Bilirubin 0.3 - 1.2 mg/dL 0.4 0.7 -  Alkaline Phos 38 - 126 U/L 49 60 -  AST 15 - 41 U/L 17 19 -  ALT 0 - 44 U/L 18 21 -   CBC Latest Ref Rng & Units 04/30/2018 04/30/2018 04/29/2018  WBC 4.0 - 10.5 K/uL 9.2 11.2(H) 9.6  Hemoglobin 13.0 - 17.0 g/dL 69.613.7 29.515.8 12.8(L)  Hematocrit 39.0 - 52.0 % 42.3 48.4 40.2  Platelets 150 - 400 K/uL 204 229 169    RADS: CLINICAL DATA:  Colon resection on Tuesday. Vomiting blood tonight. Nausea and fever.  EXAM: CT ABDOMEN AND PELVIS WITH CONTRAST  TECHNIQUE: Multidetector CT imaging of the abdomen and pelvis was performed using the standard protocol following bolus administration of intravenous contrast.  CONTRAST:  100mL ISOVUE-300 IOPAMIDOL (ISOVUE-300) INJECTION 61%  COMPARISON:  04/26/2018  FINDINGS: Lower chest: Atelectasis in the lung bases.  Hepatobiliary: No focal liver abnormality is seen. No gallstones, gallbladder wall thickening, or biliary dilatation.  Pancreas: Unremarkable. No pancreatic ductal dilatation or surrounding inflammatory changes.  Spleen: Normal in size without focal abnormality.  Adrenals/Urinary Tract: Adrenal glands are unremarkable. Kidneys are normal, without renal calculi, focal lesion, or hydronephrosis. Bladder is unremarkable.  Stomach/Bowel: Mildly dilated fluid-filled small bowel without wall thickening. In the postoperative setting, this likely represents ileus. Contrast material present throughout the colon suggesting no obstruction. Colon is decompressed. Ileal colonic anastomosis with focal  soft tissue fullness and infiltration, likely postoperative.  Vascular/Lymphatic: No significant vascular findings are present. No enlarged abdominal or pelvic lymph nodes.  Reproductive: Prostate is unremarkable.  Other: Moderate free fluid in the abdomen and pelvis with pockets in mesentery. Mildly increased density of the fluid. This probably represents postoperative seroma. No loculated collections are identified. Midline abdominal wall incision with skin clips, infiltration, and soft tissue gas consistent with postoperative change. Small amount of free air in the abdomen also likely postoperative.  Musculoskeletal: Mild degenerative changes in the spine. No destructive bone lesions.  IMPRESSION: Mildly dilated fluid-filled small bowel without wall thickening, likely representing ileus. Probable postoperative changes in the abdomen and pelvis with free air and free fluid in the abdomen and pelvis. No loculated collections are identified.   Electronically Signed   By: Burman NievesWilliam  Stevens M.D.   On: 04/30/2018 03:38  Assessment:   lap converted to open ileocectomy for stump appendicitis.  Readmitted for nausea/emesis, possible ileus, although patient was passing flatus yesterday.  Will keep NPO, IVF, pain control without narcotics as much as possible, and await full BM.

## 2018-04-30 NOTE — Plan of Care (Signed)
The patient indicated he had passed gas this morning yet could not pass any this evening and felt abdomen distended. The patient suggested to talk to Dr. Tonna BoehringerSakai about a possible NG tube placement. The patient's wife talked to Dr. Tonna BoehringerSakai on the phone as she was mentioning possible wanting her [facility]. The patient and his wife agree on an NG tube placement. A second opinion would be provided while in the hospital. The NG tube was placed and the patient has been indicating he wants the NG tube out when it drains his abdomen. Dr. Tonna BoehringerSakai has been notified over the phone about the NG tube placement and has changed the patient's medications to IV. The MD was notified that the patient may not be compliant with NG tube as well during the phone conversation.  Problem: Education: Goal: Knowledge of General Education information will improve Description Including pain rating scale, medication(s)/side effects and non-pharmacologic comfort measures Outcome: Progressing   Problem: Health Behavior/Discharge Planning: Goal: Ability to manage health-related needs will improve Outcome: Progressing   Problem: Clinical Measurements: Goal: Ability to maintain clinical measurements within normal limits will improve Outcome: Progressing Goal: Will remain free from infection Outcome: Progressing Goal: Diagnostic test results will improve Outcome: Progressing Goal: Respiratory complications will improve Outcome: Progressing Goal: Cardiovascular complication will be avoided Outcome: Progressing   Problem: Activity: Goal: Risk for activity intolerance will decrease Outcome: Progressing   Problem: Nutrition: Goal: Adequate nutrition will be maintained Outcome: Progressing   Problem: Coping: Goal: Level of anxiety will decrease Outcome: Progressing   Problem: Elimination: Goal: Will not experience complications related to bowel motility Outcome: Progressing Goal: Will not  experience complications related to urinary retention Outcome: Progressing   Problem: Pain Managment: Goal: General experience of comfort will improve Outcome: Progressing   Problem: Safety: Goal: Ability to remain free from injury will improve Outcome: Progressing   Problem: Skin Integrity: Goal: Risk for impaired skin integrity will decrease Outcome: Progressing

## 2018-04-30 NOTE — Discharge Summary (Signed)
Physician Discharge Summary  Patient ID: Garrett SiasGraham L Scaduto MRN: [id number] DOB/AGE: [birth year] 37 y.o.  Admit date: 04/26/2018 Discharge date: 04/30/2018  Admission Diagnoses: Stump appendicitis  Discharge Diagnoses:  Same as above  Discharged Condition: good  Hospital Course: Patient admitted for stump appendicitis, underwent laparoscopic converted to open ileocecectomy secondary to inflamed cecum and appendiceal stump.  Please see op note for further details.  Postop patient recovered well, tolerated soft diet, pain controlled and was passing flatus at the time of discharge.  IV antibiotics was continued throughout his hospital stay until time of discharge when labs were noted to be normal and no signs of infection noted on physical exam.  No fevers recorded throughout the hospital course as well.  Consults: None  Discharge Exam: Blood pressure 122/74, pulse 66, temperature 98 F (36.7 C), temperature source Oral, resp. rate 20, height 6\' 2"  (1.88 m), weight 109.3 kg, SpO2 100 %. General appearance: alert, cooperative and no distress GI: soft, non-tender; bowel sounds normal; no masses,  no organomegaly Incision/Wound: dressing and staples c/d/i  Disposition:  Discharge disposition: 01-Home or Self Care       Discharge Instructions    Discharge patient   Complete by:  As directed    Discharge disposition:  01-Home or Self Care   Discharge patient date:  04/29/2018     Allergies as of 04/29/2018   No Known Allergies     Medication List    TAKE these medications   acetaminophen 325 MG tablet Commonly known as:  TYLENOL Take 2 tablets (650 mg total) by mouth every 8 (eight) hours as needed for mild pain.   docusate sodium 100 MG capsule Commonly known as:  COLACE Take 1 capsule (100 mg total) by mouth 2 (two) times daily as needed for up to 10 days for mild constipation.   ibuprofen 800 MG tablet Commonly known as:  ADVIL,MOTRIN Take 1 tablet (800 mg total)  by mouth every 8 (eight) hours as needed for mild pain or moderate pain.   metaxalone 400 MG tablet Commonly known as:  SKELAXIN Take 1 tablet (400 mg total) by mouth 3 (three) times daily as needed for up to 5 days for muscle spasms.   multivitamin capsule Take 1 capsule by mouth daily.   oxyCODONE-acetaminophen 5-325 MG tablet Commonly known as:  PERCOCET/ROXICET Take 1-2 tablets by mouth every 4 (four) hours as needed for moderate pain.      Follow-up Information    Andjela Wickes, DO In 1 week.   Specialty:  Surgery Why:  Monday December 30th at 10:30am for a staple removal follow-up  Contact information: [address] [place] [id number] [phone]            Total time spent arranging discharge was >8230min. Signed: Sung Amabilesami Hugo Lybrand 04/30/2018, 2:16 PM

## 2018-04-30 NOTE — ED Notes (Addendum)
Pt has a colon resection done Tuesday and then he started vomiting blood tonight. Pt stated that he feels nauseated. Pt stated that he has also had a fever like 1 hr and he feels like he is sweating.

## 2018-04-30 NOTE — ED Provider Notes (Signed)
[facility] Emergency Department Provider Note  ____________________________________________   First MD Initiated Contact with Patient 04/30/18 [phone]     (approximate)  I have reviewed the triage vital signs and the nursing notes.   HISTORY  Chief Complaint Hematemesis and Post-op Problem    HPI Garrett SiasGraham L Morr is a 37 y.o. Mcpherson whose medical history is notable for appendectomy about 4 months ago with a retained stump which then developed stump appendicitis several days ago.  He was taken to the operating room 2 days ago for stump appendectomy which was converted to ileocecectomy when it was discovered that there was extensive pericecal phlegmon, inflammation, and adherence of the stump to the cecum.  The patient was discharged yesterday from the hospital after passing some gas.  However he reports that he has had persistent nausea which became acutely worse over the last couple of hours.  He had some moderate abdominal pain that is also gotten acutely worse.  He vomited just prior to arrival and the vomiting was severe and acute in onset and he reports that first there was some dark material that he believed to be blood and then at the end of his episode of vomiting there was bright red blood.  The vomiting caused even more severe pain in his lower abdomen and he was in significant amount of distress upon arrival to the emergency department.  He reports subjective fever.  He denies chest pain, shortness of breath, and dysuria.  He has persistent and severe nausea.  He reports that although he passed some gas earlier today he has not passed any gas or had a bowel movement for hours and he feels like he would feel better if he was able to do so.  Symptoms are severe and nothing is making them better or worse.  He is not able to tolerate anything by mouth at this time.  Past Medical History:  Diagnosis Date  . 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 Appendicitis 04/26/2018  . Acute appendicitis 12/20/2017  . Foreign body (FB) in soft tissue 08/21/2015    Past Surgical History:  Procedure Laterality Date  . APPENDECTOMY N/A 04/27/2018   Procedure: APPENDECTOMY;  Surgeon: Sung AmabileSakai, Isami, DO;  Location: [facility] ORS;  Service: General;  Laterality: N/A;  . COLONOSCOPY  2007  . LAPAROSCOPIC APPENDECTOMY N/A 12/20/2017   Procedure: APPENDECTOMY LAPAROSCOPIC;  Surgeon: Sung AmabileSakai, Isami, DO;  Location: [facility] ORS;  Service: General;  Laterality: N/A;  . LAPAROSCOPIC APPENDECTOMY N/A 04/27/2018   Procedure: ATTEMPTED APPENDECTOMY LAPAROSCOPIC CONVERTED TO OPEN;  Surgeon: Sung AmabileSakai, Isami, DO;  Location: [facility] ORS;  Service: General;  Laterality: N/A;  . UPPER GI ENDOSCOPY  2007    Prior to Admission medications   Medication Sig Start Date End Date Taking? Authorizing Provider  acetaminophen (TYLENOL) 325 MG tablet Take 2 tablets (650 mg total) by mouth every 8 (eight) hours as needed for mild pain. 04/29/18 05/29/18 Yes Sakai, Isami, DO  docusate sodium (COLACE) 100 MG capsule Take 1 capsule (100 mg total) by mouth 2 (two) times daily as needed for up to 10 days for mild constipation. 04/29/18 05/09/18 Yes Sakai, Isami, DO  ibuprofen (ADVIL,MOTRIN) 800 MG tablet Take 1 tablet (800 mg total) by mouth every 8 (eight) hours as needed for mild pain or moderate pain. 04/29/18  Yes Sakai, Isami, DO  metaxalone (SKELAXIN) 400 MG tablet Take 1 tablet (400  mg total) by mouth 3 (three) times daily as needed for up to 5 days for muscle spasms. 04/29/18 05/04/18 Yes Sakai, Isami, DO  Multiple Vitamin (MULTIVITAMIN) capsule Take 1 capsule by mouth daily.   Yes [provider]  oxyCODONE-acetaminophen (PERCOCET/ROXICET) 5-325 MG tablet Take 1-2 tablets by mouth every 4 (four) hours as needed for moderate pain. 04/29/18  Yes Sakai, Isami, DO    Allergies Patient has no known allergies.  Family History  Problem  Relation Age of Onset  . Cancer Mother        breast     Social History Social History   Tobacco Use  . Smoking status: Former Smoker    Years: 10.00    Last attempt to quit: 09/14/2003    Years since quitting: 14.6  . Smokeless tobacco: Never Used  Substance Use Topics  . Alcohol use: Yes    Alcohol/week: 0.0 standard drinks    Comment: very rarely  . Drug use: No    Review of Systems Constitutional: Subjective fever Eyes: No visual changes. ENT: No sore throat. Cardiovascular: Denies chest pain. Respiratory: Denies shortness of breath. Gastrointestinal: Severe abdominal pain with emesis and hematemesis (one episode). Genitourinary: Negative for dysuria. Musculoskeletal: Negative for neck pain.  Negative for back pain. Integumentary: Negative for rash. Neurological: Negative for headaches, focal weakness or numbness.   ____________________________________________   PHYSICAL EXAM:  VITAL SIGNS: ED Triage Vitals  Enc Vitals Group     BP 04/30/18 0159 122/79     Pulse Rate 04/30/18 0159 (!) 104     Resp 04/30/18 0159 20     Temp 04/30/18 0159 97.6 F (36.4 C)     Temp src --      SpO2 04/30/18 0159 98 %     Weight 04/30/18 0200 108.9 kg (240 lb)     Height --      Head Circumference --      Peak Flow --      Pain Score 04/30/18 0159 6     Pain Loc --      Pain Edu? --      Excl. in GC? --     Constitutional: Alert and oriented.  Appears acutely uncomfortable but nontoxic. Eyes: Conjunctivae are normal.  Head: Atraumatic. Nose: No congestion/rhinnorhea. Mouth/Throat: Mucous membranes are moist. Neck: No stridor.  No meningeal signs.   Cardiovascular: Normal rate, regular rhythm. Good peripheral circulation. Grossly normal heart sounds. Respiratory: Normal respiratory effort.  No retractions. Lungs CTAB. Gastrointestinal: Nondistended but abdomen feels a little bit tense.  Surgical wounds are still covered with postoperative mesh gauze and foam.  There  appears to be some dried blood but no acute bleeding.  There is generalized tenderness to palpation throughout the abdomen but no specific focal tenderness.  The tenderness is moderate to severe. Musculoskeletal: No lower extremity tenderness nor edema. No gross deformities of extremities. Neurologic:  Normal speech and language. No gross focal neurologic deficits are appreciated.  Skin:  Skin is warm, dry and intact. No rash noted. Psychiatric: Mood and affect are normal. Speech and behavior are normal.  ____________________________________________   LABS (all labs ordered are listed, but only abnormal results are displayed)  Labs Reviewed  COMPREHENSIVE METABOLIC PANEL - Abnormal; Notable for the following components:      Result Value   Glucose, Bld 108 (*)    All other components within normal limits  CBC WITH DIFFERENTIAL/PLATELET - Abnormal; Notable for the following components:   WBC 11.2 (*)  Monocytes Absolute 1.2 (*)    All other components within normal limits  LIPASE, BLOOD  LACTIC ACID, PLASMA  URINALYSIS, COMPLETE (UACMP) WITH MICROSCOPIC   ____________________________________________  EKG  None - EKG not ordered by ED physician ____________________________________________  RADIOLOGY   ED MD interpretation:  Post-operative ileus, possible seroma  Official radiology report(s): Ct Abdomen Pelvis W Contrast  Result Date: 04/30/2018 CLINICAL DATA:  Colon resection on Tuesday. Vomiting blood tonight. Nausea and fever. EXAM: CT ABDOMEN AND PELVIS WITH CONTRAST TECHNIQUE: Multidetector CT imaging of the abdomen and pelvis was performed using the standard protocol following bolus administration of intravenous contrast. CONTRAST:  ISOVUE-300 IOPAMIDOL (ISOVUE-300) INJECTION 61% COMPARISON:  04/26/2018 FINDINGS: Lower chest: Atelectasis in the lung bases. Hepatobiliary: No focal liver abnormality is seen. No gallstones, gallbladder wall thickening, or biliary  dilatation. Pancreas: Unremarkable. No pancreatic ductal dilatation or surrounding inflammatory changes. Spleen: Normal in size without focal abnormality. Adrenals/Urinary Tract: Adrenal glands are unremarkable. Kidneys are normal, without renal calculi, focal lesion, or hydronephrosis. Bladder is unremarkable. Stomach/Bowel: Mildly dilated fluid-filled small bowel without wall thickening. In the postoperative setting, this likely represents ileus. Contrast material present throughout the colon suggesting no obstruction. Colon is decompressed. Ileal colonic anastomosis with focal soft tissue fullness and infiltration, likely postoperative. Vascular/Lymphatic: No significant vascular findings are present. No enlarged abdominal or pelvic lymph nodes. Reproductive: Prostate is unremarkable. Other: Moderate free fluid in the abdomen and pelvis with pockets in mesentery. Mildly increased density of the fluid. This probably represents postoperative seroma. No loculated collections are identified. Midline abdominal wall incision with skin clips, infiltration, and soft tissue gas consistent with postoperative change. Small amount of free air in the abdomen also likely postoperative. Musculoskeletal: Mild degenerative changes in the spine. No destructive bone lesions. IMPRESSION: Mildly dilated fluid-filled small bowel without wall thickening, likely representing ileus. Probable postoperative changes in the abdomen and pelvis with free air and free fluid in the abdomen and pelvis. No loculated collections are identified. Electronically Signed   By: Burman Nieves M.D.   On: 04/30/2018 03:38    ____________________________________________   PROCEDURES  Critical Care performed: No   Procedure(s) performed:   Procedures   ____________________________________________   INITIAL IMPRESSION / ASSESSMENT AND PLAN / ED COURSE  As part of my medical decision making, I reviewed the following data within the  electronic MEDICAL RECORD NUMBER Nursing notes reviewed and incorporated, Labs reviewed , Old chart reviewed, Discussed with admitting physician , Notes from prior ED visits and [place] Controlled Substance Database    Differential diagnosis includes, but is not limited to, SBO/ileus, postoperative infection, internal wound dehiscence or failure of the ileocecectomy.  Hematemesis is most likely the result of a Mallory-Weiss tear.  He is hemodynamically stable in spite of his pain but the pain was severe and he received Dilaudid 1 mg IV and has already received 2 rounds of Zofran 4 mg IV.  1 L normal saline is running.  Lab work is pending.  The patient is not tolerating oral intake and I do not want to risk having him take oral contrast and making him vomit again which could worsen the abdominal pain and potentially cause damage if he is having a failure of the procedure.  Patient agrees with the plan and his pain is at least manageable at this time.  Clinical Course as of Apr 30 429  Fri Apr 30, 2018  4098 Lab work is notable for a normal comprehensive metabolic panel and a normal CBC  except for a very mild leukocytosis of 11.2.  Lipase is normal and lactic acid is normal at 0.9.  CT scan is pending.   [CF]  0405 Patient CT scan is consistent with ileus.  I updated the patient.  Given his pain, nausea, inability to tolerate fluids, and hematemesis, he needs hospital observation.  Patient agrees to stay.  I will discuss with on-call surgeon, Dr. Everlene FarrierPabon.  CT ABDOMEN PELVIS W CONTRAST [CF]  0424 I discussed the case by phone with Dr. Everlene FarrierPabon.  He agrees to put in admission orders and will see the patient in the morning.  I explained that at this point I do not think the patient needs an NG tube but if that changes we will place that while he is in the emergency department.  Dr. Everlene FarrierPabon is comfortable with this plan.  Patient will be updated.  I will give him a second liter of IV fluids to keep him hydrated before the  infusion begins with the admitting doctor's orders.   [CF]    Clinical Course User Index [CF] Loleta RoseForbach, Asti Mackley, MD    ____________________________________________  FINAL CLINICAL IMPRESSION(S) / ED DIAGNOSES  Final diagnoses:  Ileus following gastrointestinal surgery (HCC)  Generalized abdominal pain  Non-intractable vomiting with nausea, unspecified vomiting type  Post-op pain     MEDICATIONS GIVEN DURING THIS VISIT:  Medications  HYDROmorphone (DILAUDID) 1 MG/ML injection (has no administration in time range)  ondansetron (ZOFRAN) injection 4 mg (has no administration in time range)  sodium chloride 0.9 % bolus 1,000 mL (1,000 mLs Intravenous New Bag/Given 04/30/18 0417)  ondansetron (ZOFRAN) injection 4 mg (4 mg Intravenous Given 04/30/18 0211)  sodium chloride 0.9 % bolus 1,000 mL (0 mLs Intravenous Stopped 04/30/18 0327)  HYDROmorphone (DILAUDID) injection 1 mg (1 mg Intravenous Given 04/30/18 0230)  iopamidol (ISOVUE-300) 61 % injection 100 mL (100 mLs Intravenous Contrast Given 04/30/18 0305)     ED Discharge Orders    None       Note:  This document was prepared using Dragon voice recognition software and may include unintentional dictation errors.    Loleta RoseForbach, Nyala Kirchner, MD 04/30/18 0430

## 2018-05-01 NOTE — Plan of Care (Signed)
Pnt began walking and had normal BM (size, consistency, and shape)overnight;notified on call provider who allowed removal of NGT. Pnt did state if we did not remove it he would remove it himself. NGT removed and pnt told is nausea came back he likely would need another. Pnt denies nausea overnight and is saying he is ready to go home. MIVF administered as ordered  Problem: Education: Goal: Knowledge of General Education information will improve Description Including pain rating scale, medication(s)/side effects and non-pharmacologic comfort measures Outcome: Progressing   Problem: Health Behavior/Discharge Planning: Goal: Ability to manage health-related needs will improve Outcome: Progressing   Problem: Clinical Measurements: Goal: Ability to maintain clinical measurements within normal limits will improve Outcome: Progressing Goal: Will remain free from infection Outcome: Progressing Goal: Diagnostic test results will improve Outcome: Progressing Goal: Respiratory complications will improve Outcome: Progressing Goal: Cardiovascular complication will be avoided Outcome: Progressing   Problem: Activity: Goal: Risk for activity intolerance will decrease Outcome: Progressing   Problem: Nutrition: Goal: Adequate nutrition will be maintained Outcome: Progressing   Problem: Coping: Goal: Level of anxiety will decrease Outcome: Progressing   Problem: Elimination: Goal: Will not experience complications related to bowel motility Outcome: Progressing Goal: Will not experience complications related to urinary retention Outcome: Progressing   Problem: Pain Managment: Goal: General experience of comfort will improve Outcome: Progressing   Problem: Safety: Goal: Ability to remain free from injury will improve Outcome: Progressing   Problem: Skin Integrity: Goal: Risk for impaired skin integrity will decrease Outcome: Progressing

## 2018-05-01 NOTE — Plan of Care (Signed)
Patient advanced to CL, ambulated x2 in the halls and felt confident to attempt FL diet.  However, about 1 hr later (before FL diet arrived), patient became Nauseous and developed a further distention abdomen.    Diet decreased to CL and IV fluids restarted. Zofran given.

## 2018-05-01 NOTE — Progress Notes (Signed)
Patient seen on rounds this AM. Had BM last night. No c/o pain, nausea/vomiting.  Wants to eat and go home.  Past Medical History:  Diagnosis Date  . 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 APPENDECTOMY N/A 04/27/2018   Procedure: APPENDECTOMY;  Surgeon: Sung AmabileSakai, Isami, DO;  Location: [facility] ORS;  Service: General;  Laterality: N/A;  . COLONOSCOPY  2007  . LAPAROSCOPIC APPENDECTOMY N/A 12/20/2017   Procedure: APPENDECTOMY LAPAROSCOPIC;  Surgeon: Sung AmabileSakai, Isami, DO;  Location: [facility] ORS;  Service: General;  Laterality: N/A;  . LAPAROSCOPIC APPENDECTOMY N/A 04/27/2018   Procedure: ATTEMPTED APPENDECTOMY LAPAROSCOPIC CONVERTED TO OPEN;  Surgeon: Sung AmabileSakai, Isami, DO;  Location: [facility] ORS;  Service: General;  Laterality: N/A;  . UPPER GI ENDOSCOPY  2007   Family History  Problem Relation Age of Onset  . Cancer Mother        breast    Social History   Tobacco Use  . Smoking status: Former Smoker    Years: 10.00    Last attempt to quit: 09/14/2003    Years since quitting: 14.6  . Smokeless tobacco: Never Used  Substance Use Topics  . Alcohol use: Yes    Alcohol/week: 0.0 standard drinks    Comment: very rarely  . Drug use: No   Current Meds  Medication Sig  . acetaminophen (TYLENOL) 325 MG tablet Take 2 tablets (650 mg total) by mouth every 8 (eight) hours as needed for mild pain.  Marland Kitchen. docusate sodium (COLACE) 100 MG capsule Take 1 capsule (100 mg total) by mouth 2 (two) times daily as needed for up to 10 days for mild constipation.  Marland Kitchen. ibuprofen (ADVIL,MOTRIN) 800 MG tablet Take 1 tablet (800 mg total) by mouth every 8 (eight) hours as needed for mild pain or moderate pain.  . metaxalone (SKELAXIN) 400 MG tablet Take 1 tablet (400 mg total) by mouth 3 (three) times daily as needed for up to 5 days for muscle spasms.  . Multiple Vitamin (MULTIVITAMIN) capsule Take 1 capsule by mouth daily.  Marland Kitchen. oxyCODONE-acetaminophen (PERCOCET/ROXICET) 5-325 MG  tablet Take 1-2 tablets by mouth every 4 (four) hours as needed for moderate pain.   No Known Allergies  Vitals:   04/30/18 1945 05/01/18 0534  BP: (!) 150/77 116/65  Pulse: 83 82  Resp: 18 18  Temp: (!) 97.2 F (36.2 C) 97.8 F (36.6 C)  SpO2: 98% 97%    Intake/Output Summary (Last 24 hours) at 05/01/2018 1237 Last data filed at 05/01/2018 0000 Gross per 24 hour  Intake 1628.31 ml  Output 500 ml  Net 1128.31 ml   Focused abdominal exam: soft, NT/ND. Incisions well approximated with staples. No evidence of infection.    A/P: 37 y/o M readmitted for ileus after ileocecectomy for stump appendicitis. Has had ROBF, so will start clears, ADAT. Anticipate possible d/c tomorrow if tolerates diet.

## 2018-05-02 NOTE — Progress Notes (Signed)
Garrett Mcpherson has ambulated multiple laps around the nurses station and states that he is feeling much better today.Clear liquid diet was tolerated at breakfast and diet was advanced to full liquid for lunch. Full liquid diet has been tolerated. Garrett Mcpherson has reported a total of three bowel movements. The second was less watery than the first and contained "hard pellets" of stool. The third bowel movement was described as "medium and formed." His diet has now been advanced to regular.

## 2018-05-02 NOTE — Discharge Summary (Signed)
Physician Discharge Summary  Patient ID: Garrett SiasGraham L Blumenberg MRN: [id number] DOB/AGE: 1981/02/14 37 y.o.  Admit date: 04/30/2018 Discharge date: 05/02/2018  Admission Diagnoses:  Discharge Diagnoses:  Active Problems:   Ileus following gastrointestinal surgery [facility](HCC)   Discharged Condition: good  Hospital Course: Mena PaulsGraham Ferrington is a 37 year old man who was admitted for ileus after undergoing ileocecectomy for stump appendicitis.  He was monitored conservatively.  His diet was advanced slowly based upon his tolerance.  On the day of discharge he was ambulating freely, his pain was well-controlled with oral pain medications.  He had had several bowel movements, and was tolerating a solid diet.  Consults: None  Significant Diagnostic Studies: CLINICAL DATA:  Colon resection on Tuesday. Vomiting blood tonight. Nausea and fever.  EXAM: CT ABDOMEN AND PELVIS WITH CONTRAST  TECHNIQUE: Multidetector CT imaging of the abdomen and pelvis was performed using the standard protocol following bolus administration of intravenous contrast.  CONTRAST:  100mL ISOVUE-300 IOPAMIDOL (ISOVUE-300) INJECTION 61%  COMPARISON:  04/26/2018  FINDINGS: Lower chest: Atelectasis in the lung bases.  Hepatobiliary: No focal liver abnormality is seen. No gallstones, gallbladder wall thickening, or biliary dilatation.  Pancreas: Unremarkable. No pancreatic ductal dilatation or surrounding inflammatory changes.  Spleen: Normal in size without focal abnormality.  Adrenals/Urinary Tract: Adrenal glands are unremarkable. Kidneys are normal, without renal calculi, focal lesion, or hydronephrosis. Bladder is unremarkable.  Stomach/Bowel: Mildly dilated fluid-filled small bowel without wall thickening. In the postoperative setting, this likely represents ileus. Contrast material present throughout the colon suggesting no obstruction. Colon is decompressed. Ileal colonic anastomosis with focal  soft tissue fullness and infiltration, likely postoperative.  Vascular/Lymphatic: No significant vascular findings are present. No enlarged abdominal or pelvic lymph nodes.  Reproductive: Prostate is unremarkable.  Other: Moderate free fluid in the abdomen and pelvis with pockets in mesentery. Mildly increased density of the fluid. This probably represents postoperative seroma. No loculated collections are identified. Midline abdominal wall incision with skin clips, infiltration, and soft tissue gas consistent with postoperative change. Small amount of free air in the abdomen also likely postoperative.  Musculoskeletal: Mild degenerative changes in the spine. No destructive bone lesions.  IMPRESSION: Mildly dilated fluid-filled small bowel without wall thickening, likely representing ileus. Probable postoperative changes in the abdomen and pelvis with free air and free fluid in the abdomen and pelvis. No loculated collections are identified.   Electronically Signed   By: Burman NievesWilliam  Stevens M.D.   On: 04/30/2018 03:38  Results for Garrett SiasSAPPINGTON, Mykeal L (MRN [id number]) as of 05/02/2018 15:55  Ref. Range 04/30/2018 05:52  COMPREHENSIVE METABOLIC PANEL Unknown Rpt (A)  Sodium Latest Ref Range: 135 - 145 mmol/L 140  Potassium Latest Ref Range: 3.5 - 5.1 mmol/L 3.9  Chloride Latest Ref Range: 98 - 111 mmol/L 105  CO2 Latest Ref Range: 22 - 32 mmol/L 27  Glucose Latest Ref Range: 70 - 99 mg/dL 782113 (H)  BUN Latest Ref Range: 6 - 20 mg/dL 16  Creatinine Latest Ref Range: 0.61 - 1.24 mg/dL 9.560.92  Calcium Latest Ref Range: 8.9 - 10.3 mg/dL 8.5 (L)  Anion gap Latest Ref Range: 5 - 15  8  Phosphorus Latest Ref Range: 2.5 - 4.6 mg/dL 4.3  Magnesium Latest Ref Range: 1.7 - 2.4 mg/dL 1.9  Alkaline Phosphatase Latest Ref Range: 38 - 126 U/L 49  Albumin Latest Ref Range: 3.5 - 5.0 g/dL 3.3 (L)  AST Latest Ref Range: 15 - 41 U/L 17  ALT Latest Ref Range: 0 - 44 U/L 18  Total Protein  Latest Ref Range: 6.5 - 8.1 g/dL 6.2 (L)  Total Bilirubin Latest Ref Range: 0.3 - 1.2 mg/dL 0.4  GFR, Est Non African American Latest Ref Range: >60 mL/min >60  GFR, Est African American Latest Ref Range: >60 mL/min >60  WBC Latest Ref Range: 4.0 - 10.5 K/uL 9.2  RBC Latest Ref Range: 4.22 - 5.81 MIL/uL 4.81  Hemoglobin Latest Ref Range: 13.0 - 17.0 g/dL 16.113.7  HCT Latest Ref Range: 39.0 - 52.0 % 42.3  MCV Latest Ref Range: 80.0 - 100.0 fL 87.9  MCH Latest Ref Range: 26.0 - 34.0 pg 28.5  MCHC Latest Ref Range: 30.0 - 36.0 g/dL 09.632.4  RDW Latest Ref Range: 11.5 - 15.5 % 13.2  Platelets Latest Ref Range: 150 - 400 K/uL 204  nRBC Latest Ref Range: 0.0 - 0.2 % 0.0    Treatments: IV hydration, bowel rest, gradual advancement of diet, ambulation.  Discharge Exam: Blood pressure 133/75, pulse 70, temperature 98 F (36.7 C), temperature source Oral, resp. rate 19, height 6\' 2"  (1.88 m), weight 108.9 kg, SpO2 96 %. General appearance: alert, cooperative and no distress Resp: clear to auscultation bilaterally GI: soft, non-tender; bowel sounds normal; no masses,  no organomegaly Extremities: extremities normal, atraumatic, no cyanosis or edema Skin: Skin color, texture, turgor normal. No rashes or lesions  Disposition: Discharge disposition: 01-Home or Self Care       Discharge Instructions    Call MD for:  difficulty breathing, headache or visual disturbances   Complete by:  As directed    Call MD for:  extreme fatigue   Complete by:  As directed    Call MD for:  hives   Complete by:  As directed    Call MD for:  persistant dizziness or light-headedness   Complete by:  As directed    Call MD for:  persistant nausea and vomiting   Complete by:  As directed    Call MD for:  redness, tenderness, or signs of infection (pain, swelling, redness, odor or green/yellow discharge around incision site)   Complete by:  As directed    Call MD for:  severe uncontrolled pain   Complete by:  As  directed    Call MD for:  temperature >100.4   Complete by:  As directed    Diet - low sodium heart healthy   Complete by:  As directed    Driving Restrictions   Complete by:  As directed    No driving until cleared by Dr. Tonna BoehringerSakai.   Increase activity slowly   Complete by:  As directed    Lifting restrictions   Complete by:  As directed    No lifting anything heavier than 10 lbs for 6 weeks.      Follow-up Information    Ocean GroveSakai, Isami, DO. Schedule an appointment as soon as possible for a visit in 1 week(s).   Specialty:  Surgery Contact information: [address] [place] [id number] [phone]           Signed: Duanne GuessJennifer Mena Simonis 05/02/2018, 3:54 PM

## 2018-05-02 NOTE — Progress Notes (Signed)
Soft diet was tolerated. Dr. Lady Garyannon  Ordered discharge home. Discharge instructions and instructions for follow up were reviewed with patient. Questions were encouraged. Mr. Garrett Mcpherson verbalized understanding.

## 2018-05-02 NOTE — Progress Notes (Signed)
Patient of Dr. Tonna BoehringerSakai is seen on rounds this morning.  Yesterday he felt that he wanted to try to advance his diet.  He was given a clear liquid diet, which he initially tolerated.  He wanted to advance to full liquids however before the diet arrived he had nausea and abdominal distention.  As a result he was kept on clear liquids and not advanced.  This morning he states that he is feeling better and tolerated his clear liquid diet breakfast without any issues.  He reports ambulating every hour or so.  He would like to make another attempt at diet advancement today.  Vitals:   05/01/18 2013 05/02/18 0434  BP: 140/84 123/78  Pulse: 73 74  Resp: 19 18  Temp: 97.9 F (36.6 C) (!) 97.4 F (36.3 C)  SpO2: 97% 95%    Intake/Output Summary (Last 24 hours) at 05/02/2018 1213 Last data filed at 05/02/2018 0948 Gross per 24 hour  Intake 2209.48 ml  Output 0 ml  Net 2209.48 ml   Focused abdominal exam: Abdomen is soft, appropriately tender to palpation, without significant distention.  His incisions are well approximated and staples are intact.  There is no erythema induration or drainage.  Impression and plan: 37 year old male readmitted to the hospital with ileus after undergoing a ileocecal ectomy for stump appendicitis.  He did not do well yesterday with attempts to advance his diet however today he feels better and would like to try again.  We will attempt to advance his diet to full liquids by lunchtime and consider a soft or regular diet for dinner.  If he does well he may be able to go home tomorrow.

## 2018-07-09 ENCOUNTER — Telehealth: Payer: Self-pay | Admitting: Nurse Practitioner

## 2018-07-09 ENCOUNTER — Other Ambulatory Visit: Payer: Self-pay | Admitting: Orthopedic Surgery

## 2018-07-09 DIAGNOSIS — M542 Cervicalgia: Secondary | ICD-10-CM

## 2018-07-09 NOTE — Telephone Encounter (Signed)
Phone call to patient to verify medication list and allergies for myelogram procedure. Pt aware he will not need to hold any medications for this procedure.  

## 2018-07-15 ENCOUNTER — Inpatient Hospital Stay
Admission: RE | Admit: 2018-07-15 | Discharge: 2018-07-15 | Disposition: A | Discharge status: Home / Self Care | Payer: Self-pay | Source: Ambulatory Visit | Attending: Orthopedic Surgery | Admitting: Orthopedic Surgery

## 2018-07-15 ENCOUNTER — Other Ambulatory Visit: Payer: Self-pay

## 2018-07-15 NOTE — Discharge Instructions (Signed)

## 2018-07-27 ENCOUNTER — Other Ambulatory Visit: Payer: Self-pay

## 2018-07-27 ENCOUNTER — Ambulatory Visit (INDEPENDENT_AMBULATORY_CARE_PROVIDER_SITE_OTHER): Payer: BLUE CROSS/BLUE SHIELD

## 2018-07-27 ENCOUNTER — Encounter: Payer: Self-pay | Admitting: General Surgery

## 2018-07-27 ENCOUNTER — Ambulatory Visit (INDEPENDENT_AMBULATORY_CARE_PROVIDER_SITE_OTHER): Payer: BLUE CROSS/BLUE SHIELD | Admitting: General Surgery

## 2018-07-27 VITALS — BP 128/74 | HR 73 | Temp 97.2°F | Resp 12 | Ht 74.0 in | Wt 245.0 lb

## 2018-07-27 DIAGNOSIS — R221 Localized swelling, mass and lump, neck: Secondary | ICD-10-CM

## 2018-07-27 NOTE — Patient Instructions (Addendum)
The patient is aware to call back for any questions or new concerns. Schedule office excision

## 2018-07-27 NOTE — Progress Notes (Signed)
x

## 2018-07-27 NOTE — Progress Notes (Signed)
Patient ID: Garrett Mcpherson, male   DOB: 09-Jun-1980, 38 y.o.   MRN: [id number]  Chief Complaint  Patient presents with  . Mass    ref Dr.Daniel Dorris Fetch wants BB in neck removed    HPI Garrett Mcpherson is a 38 y.o. male here today for a evaluation of a BB in his neck. Patient noticed this since he was 38 years old. He states he has a bulging disk and needs an MRI. He states he has noticed some swelling as well.  The patient was seen in April 2017 in regards to this process and it was like to that time to do a myelogram rather than an MRI.  He reported this was a horrible experience.  HPI  Past Medical History:  Diagnosis Date  . 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 APPENDECTOMY N/A 04/27/2018   Procedure: APPENDECTOMY;  Surgeon: Sung Amabile, DO;  Location: [facility] ORS;  Service: General;  Laterality: N/A;  . COLONOSCOPY  2007  . LAPAROSCOPIC APPENDECTOMY N/A 12/20/2017   Procedure: APPENDECTOMY LAPAROSCOPIC;  Surgeon: Sung Amabile, DO;  Location: [facility] ORS;  Service: General;  Laterality: N/A;  . LAPAROSCOPIC APPENDECTOMY N/A 04/27/2018   Procedure: ATTEMPTED APPENDECTOMY LAPAROSCOPIC CONVERTED TO OPEN;  Surgeon: Sung Amabile, DO;  Location: [facility] ORS;  Service: General;  Laterality: N/A;  . UPPER GI ENDOSCOPY  2007    Family History  Problem Relation Age of Onset  . Cancer Mother        breast     Social History Social History   Tobacco Use  . Smoking status: Former Smoker    Years: 10.00    Last attempt to quit: 09/14/2003    Years since quitting: 14.8  . Smokeless tobacco: Never Used  Substance Use Topics  . Alcohol use: Yes    Alcohol/week: 0.0 standard drinks    Comment: very rarely  . Drug use: No    No Known Allergies  Current Outpatient Medications  Medication Sig Dispense Refill  . ibuprofen (ADVIL,MOTRIN) 800 MG tablet Take 1 tablet (800 mg total) by mouth every 8 (eight) hours as needed for  mild pain or moderate pain. 30 tablet 0  . Multiple Vitamin (MULTIVITAMIN) capsule Take 1 capsule by mouth daily.     No current facility-administered medications for this visit.     Review of Systems Review of Systems  Constitutional: Negative.   Respiratory: Negative.   Cardiovascular: Negative.     Blood pressure 128/74, pulse 73, temperature (!) 97.2 F (36.2 C), temperature source Temporal, resp. rate 12, height 6\' 2"  (1.88 m), weight 245 lb (111.1 kg), SpO2 98 %.  Physical Exam Physical Exam Constitutional:      Appearance: Normal appearance.  Neck:      Comments: Right neck mass Skin:    General: Skin is warm and dry.  Neurological:     Mental Status: He is alert and oriented to person, place, and time.  Psychiatric:        Mood and Affect: Mood normal.        Behavior: Behavior normal.     Data Reviewed Limited ultrasound was completed which seems to identify a foreign body in the soft tissue just below the dermis.  No images.  No charge.  Assessment Foreign body of the neck.  Plan  We will arrange to excise this is an office procedure at a convenient time.  HPI, Physical Exam, Assessment and Plan have been scribed under the direction and in the presence of Donnalee Curry, MD.  Ples Specter, CMA  HPI, assessment, plan and physical exam has been scribed under the direction and in the presence of Earline Mayotte, MD. Dorathy Daft, RN  I have completed the exam and reviewed the above documentation for accuracy and completeness.  I agree with the above.  Museum/gallery conservator has been used and any errors in dictation or transcription are unintentional.  Donnalee Curry, M.D., F.A.C.S.  Merrily Pew Issiac Jamar 07/28/2018, 3:29 PM

## 2018-07-29 ENCOUNTER — Ambulatory Visit: Payer: BLUE CROSS/BLUE SHIELD | Admitting: General Surgery

## 2018-08-05 ENCOUNTER — Other Ambulatory Visit: Payer: Self-pay

## 2018-08-05 ENCOUNTER — Ambulatory Visit: Payer: BLUE CROSS/BLUE SHIELD | Admitting: General Surgery

## 2018-08-05 ENCOUNTER — Encounter: Payer: Self-pay | Admitting: General Surgery

## 2018-08-05 VITALS — BP 145/74 | HR 81 | Temp 97.5°F | Resp 16 | Ht 74.0 in | Wt 239.0 lb

## 2018-08-05 DIAGNOSIS — R221 Localized swelling, mass and lump, neck: Secondary | ICD-10-CM

## 2018-08-05 NOTE — Progress Notes (Signed)
Patient ID: Garrett Mcpherson, male   DOB: Apr 08, 1981, 38 y.o.   MRN: [id number]  Chief Complaint  Patient presents with  . Procedure    HPI Garrett Mcpherson is a 38 y.o. male.  Here for excision right neck foreign body.  Examination earlier this month had suggested that the roots residing underneath the skin based on ultrasound.  HPI  Past Medical History:  Diagnosis Date  . 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 APPENDECTOMY N/A 04/27/2018   Procedure: APPENDECTOMY;  Surgeon: Sung Amabile, DO;  Location: [facility] ORS;  Service: General;  Laterality: N/A;  . COLONOSCOPY  2007  . LAPAROSCOPIC APPENDECTOMY N/A 12/20/2017   Procedure: APPENDECTOMY LAPAROSCOPIC;  Surgeon: Sung Amabile, DO;  Location: [facility] ORS;  Service: General;  Laterality: N/A;  . LAPAROSCOPIC APPENDECTOMY N/A 04/27/2018   Procedure: ATTEMPTED APPENDECTOMY LAPAROSCOPIC CONVERTED TO OPEN;  Surgeon: Sung Amabile, DO;  Location: [facility] ORS;  Service: General;  Laterality: N/A;  . UPPER GI ENDOSCOPY  2007    Family History  Problem Relation Age of Onset  . Cancer Mother        breast     Social History Social History   Tobacco Use  . Smoking status: Former Smoker    Years: 10.00    Last attempt to quit: 09/14/2003    Years since quitting: 14.9  . Smokeless tobacco: Never Used  Substance Use Topics  . Alcohol use: Yes    Alcohol/week: 0.0 standard drinks    Comment: very rarely  . Drug use: No    No Known Allergies  Current Outpatient Medications  Medication Sig Dispense Refill  . ibuprofen (ADVIL,MOTRIN) 800 MG tablet Take 1 tablet (800 mg total) by mouth every 8 (eight) hours as needed for mild pain or moderate pain. 30 tablet 0  . Multiple Vitamin (MULTIVITAMIN) capsule Take 1 capsule by mouth daily.     No current facility-administered medications for this visit.     Review of Systems Review of Systems  Constitutional: Negative.    Respiratory: Negative.   Cardiovascular: Negative.     Blood pressure (!) 145/74, pulse 81, temperature (!) 97.5 F (36.4 C), temperature source Temporal, resp. rate 16, height 6\' 2"  (1.88 m), weight 239 lb (108.4 kg), SpO2 97 %.  Physical Exam Physical Exam Palpation of the right base of the neck showed thickening near a previous incision.  The area was marked.  ChloraPrep applied.  10 cc of 0.5% Xylocaine with 0.25% Marcaine with 1 to 200,000 units of epinephrine utilized above the fascial layer.  New layer of ChloraPrep.  A 1 cm incision was made and thick scar tissue but no evidence of a foreign body was identified.  The deep fascia was exposed with the border of the digastric evident, again no foreign body appreciated.  Scant bleeding was noted and controlled with direct pressure.  The skin was closed with 5-0 Prolene sutures x2.  Telfa and Tegaderm dressing applied.     Assessment Retained foreign body of the neck.  Plan Will discuss with MRI if the foreign body at the C4-5 level will preclude adequate visualization.  If so we will need to undertake operative exploration under anesthesia and with fluoroscopy.   HPI, assessment, plan and physical exam has been scribed under the direction and in the presence of Earline Mayotte, MD. Dorathy Daft, RN  I have completed the exam and reviewed the above  documentation for accuracy and completeness.  I agree with the above.  Museum/gallery conservator has been used and any errors in dictation or transcription are unintentional.  Donnalee Curry, M.D., F.A.C.S.   Merrily Pew Megahn Killings 08/05/2018, 12:24 PM

## 2018-08-05 NOTE — Patient Instructions (Addendum)
The patient is aware to call back for any questions or new concerns. Schedule surgery May use an Ice pack as needed for comfort

## 2018-08-10 ENCOUNTER — Telehealth: Payer: Self-pay | Admitting: General Surgery

## 2018-08-10 NOTE — Telephone Encounter (Signed)
The CT films of 2015 were reviewed with radiology.  The foreign body is posterior and inferior to the internal jugular and carotid arteries.  MRI is not possible without knowing the metal of the BB, lead versus steel.  The films were reviewed with ENT regarding the possibility use of their navigation system for extraction.  This device is limited to the sinuses.  An excellent suggestion was had that wire localization through the sternocleidomastoid muscle to avoid the vessels anteriorly and the spinal accessory nerve posteriorly would likely be successful.  This procedure was reviewed with the radiologist who thought it was a very doable.  This information was passed onto the patient.  Once the band on elective surgery is over will arrange for him to have a CT-guided wire localization of the right neck foreign body followed by operative exploration.

## 2018-08-12 ENCOUNTER — Ambulatory Visit: Payer: Self-pay

## 2018-10-20 ENCOUNTER — Telehealth: Payer: Self-pay | Admitting: *Deleted

## 2018-10-20 NOTE — Telephone Encounter (Signed)
Patient called the office today wanting to schedule surgery with Dr. Bary Castilla. This had been postponed due to COVID-19 restrictions.   Patient's surgery to be scheduled for 11-05-18 at [facility] with Dr. Bary Castilla.  The patient is aware to have COVID-19 testing done on 11-02-18. He is aware to isolate after, have no visitors, wash hands frequently, and avoid touching face.   The patient is aware he will be contacted by the [facility] to complete a phone interview sometime in the near future.  Patient aware to be NPO after midnight and have a driver.   He is aware to check in at the [facility] entrance where he will be screened for the coronavirus and then sent to Same Day Surgery.   Patient aware that he may have no visitors and driver will need to wait in the car due to COVID-19 restrictions.   The patient verbalizes understanding of the above.   Patient will be contacted once surgery has been posted with [facility].   Also, need to check with Dr. Bary Castilla to see if a pre-op visit will be required.

## 2018-10-21 ENCOUNTER — Other Ambulatory Visit: Payer: Self-pay | Admitting: General Surgery

## 2018-10-21 DIAGNOSIS — R221 Localized swelling, mass and lump, neck: Secondary | ICD-10-CM

## 2018-10-25 NOTE — Telephone Encounter (Signed)
Patient states he does not have questions at this time.   No pre-op visit will be required with Dr. Bary Castilla. History and physical will be updated the morning of procedure.

## 2018-10-25 NOTE — Telephone Encounter (Signed)
Patient contacted today and notified that surgery for 11-05-18 is a go. He is aware that I am still trying to work out all the details since he will need the CT wire localization prior to presenting to the O.R. Patient to be contacted for further instructions once we here back from [facility] in [employer].   The patient is aware he will be contacted for a phone interview on 11-01-18 and reminded to go for COVID testing on 11-02-18 between 8 am and 10:30 am.  Patient verbalizes understanding of the above.

## 2018-10-29 ENCOUNTER — Other Ambulatory Visit: Payer: Self-pay

## 2018-10-29 DIAGNOSIS — R221 Localized swelling, mass and lump, neck: Secondary | ICD-10-CM

## 2018-11-01 ENCOUNTER — Encounter
Admission: RE | Admit: 2018-11-01 | Discharge: 2018-11-01 | Disposition: A | Discharge status: Home / Self Care | Payer: BC Managed Care – PPO | Source: Ambulatory Visit | Attending: General Surgery | Admitting: General Surgery

## 2018-11-01 ENCOUNTER — Other Ambulatory Visit: Payer: Self-pay

## 2018-11-01 NOTE — Patient Instructions (Addendum)
Your procedure is scheduled on: Friday 11/05/18 Report to [place]. To find out your arrival time please call [phone] between 1PM - 3PM on Thursday 11/04/18.  Remember: Instructions that are not followed completely may result in serious medical risk, up to and including death, or upon the discretion of your surgeon and anesthesiologist your surgery may need to be rescheduled.     _X__ 1. Do not eat food after midnight the night before your procedure.                 No gum chewing or hard candies. You may drink clear liquids up to 2 hours                 before you are scheduled to arrive for your surgery- DO not drink clear                 liquids within 2 hours of the start of your surgery.                 Clear Liquids include:  water, apple juice without pulp, clear carbohydrate                 drink such as Clearfast or Gatorade, Black Coffee or Tea (Do not add                 anything to coffee or tea).  __X__2.  On the morning of surgery brush your teeth with toothpaste and water, you                 may rinse your mouth with mouthwash if you wish.  Do not swallow any              toothpaste of mouthwash.     _X__ 3.  No Alcohol for 24 hours before or after surgery.   _X__ 4.  Do Not Smoke or use e-cigarettes For 24 Hours Prior to Your Surgery.                 Do not use any chewable tobacco products for at least 6 hours prior to                 surgery.  ____  5.  Bring all medications with you on the day of surgery if instructed.   __X__  6.  Notify your doctor if there is any change in your medical condition      (cold, fever, infections).     Do not wear jewelry, make-up, hairpins, clips or nail polish. Do not wear lotions, powders, or perfumes.  Do not shave 48 hours prior to surgery. Men may shave face and neck. Do not bring valuables to the hospital.    [facility] is not responsible for any belongings or  valuables.  Contacts, dentures/partials or body piercings may not be worn into surgery. Bring a case for your contacts, glasses or hearing aids, a denture cup will be supplied. Leave your suitcase in the car. After surgery it may be brought to your room. For patients admitted to the hospital, discharge time is determined by your treatment team.   Patients discharged the day of surgery will not be allowed to drive home.   Please read over the following fact sheets that you were given:   MRSA Information  __X__ Take these medicines the morning of surgery with A SIP OF WATER:  1. NONE  2.   3.   4.  5.  6.  ____ Fleet Enema (as directed)   __X__ Use CHG Soap/SAGE wipes as directed  ____ Use inhalers on the day of surgery  ____ Stop metformin/Janumet/Farxiga 2 days prior to surgery    ____ Take 1/2 of usual insulin dose the night before surgery. No insulin the morning          of surgery.   ____ Stop Blood Thinners Coumadin/Plavix/Xarelto/Pleta/Pradaxa/Eliquis/Effient/Aspirin  on   Or contact your Surgeon, Cardiologist or Medical Doctor regarding  ability to stop your blood thinners  __X__ Stop Anti-inflammatories 7 days before surgery such as Advil, Ibuprofen, Motrin,  BC or Goodies Powder, Naprosyn, Naproxen, Aleve, Aspirin    __X__ Stop all herbal supplements, fish oil or vitamin E until after surgery.    ____ Bring C-Pap to the hospital.      PHONE INTERVIEW. INSTRUCTIONS REVIEWED WITH PATIENT. UNDERSTANDING VERBALIZED./CN

## 2018-11-02 ENCOUNTER — Encounter
Admission: RE | Admit: 2018-11-02 | Discharge: 2018-11-02 | Disposition: A | Discharge status: Home / Self Care | Payer: BC Managed Care – PPO | Source: Ambulatory Visit | Attending: General Surgery | Admitting: General Surgery

## 2018-11-02 DIAGNOSIS — Z1159 Encounter for screening for other viral diseases: Secondary | ICD-10-CM | POA: Insufficient documentation

## 2018-11-04 ENCOUNTER — Encounter: Payer: Self-pay | Admitting: Anesthesiology

## 2018-11-04 LAB — NOVEL CORONAVIRUS, NAA (HOSP ORDER, SEND-OUT TO REF LAB; TAT 18-24 HRS): SARS-CoV-2, NAA: NOT DETECTED

## 2018-11-05 ENCOUNTER — Ambulatory Visit: Payer: BC Managed Care – PPO | Admitting: Anesthesiology

## 2018-11-05 ENCOUNTER — Ambulatory Visit: Payer: BC Managed Care – PPO

## 2018-11-05 ENCOUNTER — Encounter
Admission: RE | Disposition: A | Discharge status: Home / Self Care | Payer: Self-pay | Source: Home / Self Care | Attending: General Surgery

## 2018-11-05 ENCOUNTER — Encounter: Payer: Self-pay | Admitting: *Deleted

## 2018-11-05 ENCOUNTER — Ambulatory Visit
Admission: RE | Admit: 2018-11-05 | Discharge: 2018-11-05 | Disposition: A | Discharge status: Home / Self Care | Payer: BC Managed Care – PPO | Source: Ambulatory Visit | Attending: General Surgery | Admitting: General Surgery

## 2018-11-05 ENCOUNTER — Other Ambulatory Visit: Payer: Self-pay

## 2018-11-05 ENCOUNTER — Ambulatory Visit
Admission: RE | Admit: 2018-11-05 | Discharge: 2018-11-05 | Disposition: A | Discharge status: Home / Self Care | Payer: BC Managed Care – PPO | Attending: General Surgery | Admitting: General Surgery

## 2018-11-05 DIAGNOSIS — R221 Localized swelling, mass and lump, neck: Secondary | ICD-10-CM

## 2018-11-05 DIAGNOSIS — Z87891 Personal history of nicotine dependence: Secondary | ICD-10-CM | POA: Diagnosis not present

## 2018-11-05 DIAGNOSIS — M795 Residual foreign body in soft tissue: Secondary | ICD-10-CM | POA: Diagnosis not present

## 2018-11-05 DIAGNOSIS — Z791 Long term (current) use of non-steroidal anti-inflammatories (NSAID): Secondary | ICD-10-CM | POA: Insufficient documentation

## 2018-11-05 HISTORY — PX: FOREIGN BODY REMOVAL: SHX962

## 2018-11-05 SURGERY — FOREIGN BODY REMOVAL ADULT
Anesthesia: General | Laterality: Right

## 2018-11-05 MED ORDER — SUCCINYLCHOLINE CHLORIDE 20 MG/ML IJ SOLN
INTRAMUSCULAR | Status: DC | PRN
  Administered 2018-11-05: 100 mg via INTRAVENOUS

## 2018-11-05 MED ORDER — FENTANYL CITRATE (PF) 100 MCG/2ML IJ SOLN
INTRAMUSCULAR | Status: AC
  Filled 2018-11-05: qty 2

## 2018-11-05 MED ORDER — HYDROCODONE-ACETAMINOPHEN 5-325 MG PO TABS
ORAL_TABLET | ORAL | Status: AC
  Filled 2018-11-05: qty 1

## 2018-11-05 MED ORDER — ACETAMINOPHEN 10 MG/ML IV SOLN
INTRAVENOUS | Status: DC | PRN
  Administered 2018-11-05: 1000 mg via INTRAVENOUS

## 2018-11-05 MED ORDER — LACTATED RINGERS IV SOLN
INTRAVENOUS | Status: DC
  Administered 2018-11-05 (×2): via INTRAVENOUS

## 2018-11-05 MED ORDER — DEXAMETHASONE SODIUM PHOSPHATE 10 MG/ML IJ SOLN
INTRAMUSCULAR | Status: AC
  Filled 2018-11-05: qty 1

## 2018-11-05 MED ORDER — HYDROCODONE-ACETAMINOPHEN 5-325 MG PO TABS: 1.0000 | ORAL_TABLET | ORAL | 0 refills | Status: DC | PRN

## 2018-11-05 MED ORDER — MIDAZOLAM HCL 2 MG/2ML IJ SOLN
INTRAMUSCULAR | Status: DC | PRN
  Administered 2018-11-05: 2 mg via INTRAVENOUS

## 2018-11-05 MED ORDER — PROPOFOL 10 MG/ML IV BOLUS
INTRAVENOUS | Status: AC
  Filled 2018-11-05: qty 40

## 2018-11-05 MED ORDER — FAMOTIDINE 20 MG PO TABS
20.0000 mg | ORAL_TABLET | Freq: Once | ORAL | Status: AC
  Administered 2018-11-05: 20 mg via ORAL

## 2018-11-05 MED ORDER — SUGAMMADEX SODIUM 500 MG/5ML IV SOLN
INTRAVENOUS | Status: DC | PRN
  Administered 2018-11-05: 100 mg via INTRAVENOUS

## 2018-11-05 MED ORDER — FENTANYL CITRATE (PF) 100 MCG/2ML IJ SOLN
INTRAMUSCULAR | Status: DC | PRN
  Administered 2018-11-05: 50 ug via INTRAVENOUS

## 2018-11-05 MED ORDER — ONDANSETRON HCL 4 MG/2ML IJ SOLN: 4.0000 mg | Freq: Once | INTRAMUSCULAR | Status: DC | PRN

## 2018-11-05 MED ORDER — ROCURONIUM BROMIDE 100 MG/10ML IV SOLN
INTRAVENOUS | Status: DC | PRN
  Administered 2018-11-05: 20 mg via INTRAVENOUS

## 2018-11-05 MED ORDER — ONDANSETRON HCL 4 MG/2ML IJ SOLN
INTRAMUSCULAR | Status: AC
  Filled 2018-11-05: qty 2

## 2018-11-05 MED ORDER — BUPIVACAINE-EPINEPHRINE (PF) 0.5% -1:200000 IJ SOLN
INTRAMUSCULAR | Status: DC | PRN
  Administered 2018-11-05: 20 mL

## 2018-11-05 MED ORDER — PROPOFOL 10 MG/ML IV BOLUS
INTRAVENOUS | Status: DC | PRN
  Administered 2018-11-05: 160 mg via INTRAVENOUS

## 2018-11-05 MED ORDER — ONDANSETRON HCL 4 MG/2ML IJ SOLN
INTRAMUSCULAR | Status: DC | PRN
  Administered 2018-11-05: 4 mg via INTRAVENOUS

## 2018-11-05 MED ORDER — LIDOCAINE HCL (PF) 2 % IJ SOLN
INTRAMUSCULAR | Status: AC
  Filled 2018-11-05: qty 10

## 2018-11-05 MED ORDER — HYDROCODONE-ACETAMINOPHEN 5-325 MG PO TABS
1.0000 | ORAL_TABLET | ORAL | Status: DC | PRN
  Administered 2018-11-05: 1 via ORAL

## 2018-11-05 MED ORDER — MIDAZOLAM HCL 2 MG/2ML IJ SOLN
INTRAMUSCULAR | Status: AC
  Filled 2018-11-05: qty 2

## 2018-11-05 MED ORDER — ACETAMINOPHEN 10 MG/ML IV SOLN
INTRAVENOUS | Status: AC
  Filled 2018-11-05: qty 100

## 2018-11-05 MED ORDER — FAMOTIDINE 20 MG PO TABS
ORAL_TABLET | ORAL | Status: AC
  Administered 2018-11-05: 10:00:00 20 mg via ORAL
  Filled 2018-11-05: qty 1

## 2018-11-05 MED ORDER — LIDOCAINE HCL (CARDIAC) PF 100 MG/5ML IV SOSY
PREFILLED_SYRINGE | INTRAVENOUS | Status: DC | PRN
  Administered 2018-11-05: 6 mg via INTRAVENOUS

## 2018-11-05 MED ORDER — DEXAMETHASONE SODIUM PHOSPHATE 10 MG/ML IJ SOLN
INTRAMUSCULAR | Status: DC | PRN
  Administered 2018-11-05: 5 mg via INTRAVENOUS

## 2018-11-05 MED ORDER — FENTANYL CITRATE (PF) 100 MCG/2ML IJ SOLN
INTRAMUSCULAR | Status: AC
  Administered 2018-11-05: 12:00:00 25 ug via INTRAVENOUS
  Filled 2018-11-05: qty 2

## 2018-11-05 MED ORDER — KETOROLAC TROMETHAMINE 30 MG/ML IJ SOLN
INTRAMUSCULAR | Status: DC | PRN
  Administered 2018-11-05: 30 mg via INTRAVENOUS

## 2018-11-05 MED ORDER — GABAPENTIN 300 MG PO CAPS
ORAL_CAPSULE | ORAL | Status: AC
  Administered 2018-11-05: 300 mg via ORAL
  Filled 2018-11-05: qty 1

## 2018-11-05 MED ORDER — GABAPENTIN 300 MG PO CAPS
300.0000 mg | ORAL_CAPSULE | ORAL | Status: AC
  Administered 2018-11-05: 300 mg via ORAL

## 2018-11-05 MED ORDER — FENTANYL CITRATE (PF) 100 MCG/2ML IJ SOLN
25.0000 ug | INTRAMUSCULAR | Status: DC | PRN
  Administered 2018-11-05 (×2): 25 ug via INTRAVENOUS

## 2018-11-05 MED ORDER — BUPIVACAINE-EPINEPHRINE (PF) 0.5% -1:200000 IJ SOLN
INTRAMUSCULAR | Status: AC
  Filled 2018-11-05: qty 30

## 2018-11-05 SURGICAL SUPPLY — 30 items
BLADE SURG 15 STRL SS SAFETY (BLADE) ×3 IMPLANT
CANISTER SUCT 1200ML W/VALVE (MISCELLANEOUS) ×3 IMPLANT
CHLORAPREP W/TINT 26 (MISCELLANEOUS) ×3 IMPLANT
CLOSURE WOUND 1/2 X4 (GAUZE/BANDAGES/DRESSINGS) ×1
COVER WAND RF STERILE (DRAPES) ×3 IMPLANT
DRAPE C-ARM XRAY 36X54 (DRAPES) ×2 IMPLANT
DRAPE LAPAROTOMY 77X122 PED (DRAPES) ×3 IMPLANT
DRSG TEGADERM 4X4.75 (GAUZE/BANDAGES/DRESSINGS) ×3 IMPLANT
DRSG TELFA 4X3 1S NADH ST (GAUZE/BANDAGES/DRESSINGS) ×3 IMPLANT
ELECT CAUTERY BLADE 6.4 (BLADE) ×3 IMPLANT
ELECT REM PT RETURN 9FT ADLT (ELECTROSURGICAL) ×3
ELECTRODE REM PT RTRN 9FT ADLT (ELECTROSURGICAL) ×1 IMPLANT
GLOVE BIO SURGEON STRL SZ7.5 (GLOVE) ×7 IMPLANT
GLOVE INDICATOR 8.0 STRL GRN (GLOVE) ×7 IMPLANT
GOWN STRL REUS W/ TWL LRG LVL3 (GOWN DISPOSABLE) ×2 IMPLANT
GOWN STRL REUS W/TWL LRG LVL3 (GOWN DISPOSABLE) ×6
KIT TURNOVER KIT A (KITS) ×3 IMPLANT
LABEL OR SOLS (LABEL) ×3 IMPLANT
NEEDLE HYPO 22GX1.5 SAFETY (NEEDLE) ×3 IMPLANT
NS IRRIG 500ML POUR BTL (IV SOLUTION) ×3 IMPLANT
PACK BASIN MINOR ARMC (MISCELLANEOUS) ×3 IMPLANT
STRIP CLOSURE SKIN 1/2X4 (GAUZE/BANDAGES/DRESSINGS) ×2 IMPLANT
SUT VIC AB 2-0 CT1 (SUTURE) ×1 IMPLANT
SUT VIC AB 3-0 SH 27 (SUTURE) ×2
SUT VIC AB 3-0 SH 27X BRD (SUTURE) ×1 IMPLANT
SUT VIC AB 4-0 FS2 27 (SUTURE) ×3 IMPLANT
SWABSTK COMLB BENZOIN TINCTURE (MISCELLANEOUS) ×3 IMPLANT
SYR 10ML LL (SYRINGE) ×2 IMPLANT
SYR CONTROL 10ML (SYRINGE) ×1 IMPLANT
TAPE TRANSPORE STRL 2 31045 (GAUZE/BANDAGES/DRESSINGS) ×2 IMPLANT

## 2018-11-05 NOTE — Anesthesia Post-op Follow-up Note (Signed)
Anesthesia QCDR form completed.        

## 2018-11-05 NOTE — Discharge Instructions (Signed)

## 2018-11-05 NOTE — Anesthesia Procedure Notes (Signed)
Procedure Name: Intubation Date/Time: 11/05/2018 10:55 AM Performed by: Gentry Fitz, CRNA Pre-anesthesia Checklist: Patient identified, Emergency Drugs available, Suction available and Patient being monitored Patient Re-evaluated:Patient Re-evaluated prior to induction Oxygen Delivery Method: Circle system utilized Preoxygenation: Pre-oxygenation with 100% oxygen Induction Type: IV induction and Cricoid Pressure applied Ventilation: Mask ventilation without difficulty Laryngoscope Size: Mac and 4 Grade View: Grade II Tube type: Oral Tube size: 7.5 mm Number of attempts: 1 Airway Equipment and Method: Stylet Placement Confirmation: ETT inserted through vocal cords under direct vision,  positive ETCO2 and breath sounds checked- equal and bilateral Secured at: 23 cm Tube secured with: Tape Dental Injury: Teeth and Oropharynx as per pre-operative assessment

## 2018-11-05 NOTE — Anesthesia Preprocedure Evaluation (Signed)
Anesthesia Evaluation  Patient identified by MRN, date of birth, ID band Patient awake    Reviewed: Allergy & Precautions, NPO status , Patient's Chart, lab work & pertinent test results, reviewed documented beta blocker date and time   Airway Mallampati: III  TM Distance: >3 FB     Dental  (+) Chipped   Pulmonary former smoker,           Cardiovascular      Neuro/Psych    GI/Hepatic   Endo/Other    Renal/GU      Musculoskeletal   Abdominal   Peds  Hematology   Anesthesia Other Findings   Reproductive/Obstetrics                             Anesthesia Physical Anesthesia Plan  ASA: II  Anesthesia Plan: General   Post-op Pain Management:    Induction: Intravenous  PONV Risk Score and Plan:   Airway Management Planned: Oral ETT  Additional Equipment:   Intra-op Plan:   Post-operative Plan:   Informed Consent: I have reviewed the patients History and Physical, chart, labs and discussed the procedure including the risks, benefits and alternatives for the proposed anesthesia with the patient or authorized representative who has indicated his/her understanding and acceptance.       Plan Discussed with: CRNA  Anesthesia Plan Comments:         Anesthesia Quick Evaluation
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
## 2018-11-05 NOTE — Anesthesia Postprocedure Evaluation (Signed)
Anesthesia Post Note  Patient: Garrett Mcpherson  Procedure(s) Performed: FOREIGN BODY REMOVAL ADULT - EXCISION FOREIGN BODY RIGHT NECK WITH CT WIRE LOCALIZATION (Right )  Patient location during evaluation: PACU Anesthesia Type: General Level of consciousness: awake and alert Pain management: pain level controlled Vital Signs Assessment: post-procedure vital signs reviewed and stable Respiratory status: spontaneous breathing, nonlabored ventilation, respiratory function stable and patient connected to nasal cannula oxygen Cardiovascular status: blood pressure returned to baseline and stable Postop Assessment: no apparent nausea or vomiting Anesthetic complications: no     Last Vitals:  Vitals:   11/05/18 1245 11/05/18 1301  BP: 135/84 129/76  Pulse: 77 66  Resp: 12 16  Temp:  (!) 36.1 C  SpO2: 95% 96%    Last Pain:  Vitals:   11/05/18 1301  TempSrc: Temporal  PainSc: [place]

## 2018-11-05 NOTE — Transfer of Care (Signed)
Immediate Anesthesia Transfer of Care Note  Patient: Garrett Mcpherson  Procedure(s) Performed: FOREIGN BODY REMOVAL ADULT - EXCISION FOREIGN BODY RIGHT NECK WITH CT WIRE LOCALIZATION (Right )  Patient Location: PACU  Anesthesia Type:General  Level of Consciousness: drowsy  Airway & Oxygen Therapy: Patient Spontanous Breathing and Patient connected to face mask oxygen  Post-op Assessment: Report given to RN and Post -op Vital signs reviewed and stable  Post vital signs: Reviewed and stable  Last Vitals:  Vitals Value Taken Time  BP 143/89 11/05/18 1140  Temp 36.2 C 11/05/18 1140  Pulse 84 11/05/18 1142  Resp 15 11/05/18 1142  SpO2 98 % 11/05/18 1142  Vitals shown include unvalidated device data.  Last Pain:  Vitals:   11/05/18 1013  TempSrc: Temporal  PainSc: 5          Complications: No apparent anesthesia complications

## 2018-11-16 ENCOUNTER — Ambulatory Visit (INDEPENDENT_AMBULATORY_CARE_PROVIDER_SITE_OTHER): Payer: Self-pay | Admitting: General Surgery

## 2018-11-16 ENCOUNTER — Other Ambulatory Visit: Payer: Self-pay

## 2018-11-16 ENCOUNTER — Encounter: Payer: BLUE CROSS/BLUE SHIELD | Admitting: General Surgery

## 2018-11-16 ENCOUNTER — Encounter: Payer: Self-pay | Admitting: General Surgery

## 2018-11-16 VITALS — BP 120/80 | HR 70 | Temp 97.2°F | Ht 74.0 in | Wt 250.0 lb

## 2018-11-16 DIAGNOSIS — R221 Localized swelling, mass and lump, neck: Secondary | ICD-10-CM
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
## 2018-11-23 ENCOUNTER — Encounter: Payer: Self-pay | Admitting: General Surgery

## 2019-01-03 ENCOUNTER — Telehealth: Payer: Self-pay

## 2019-01-03 ENCOUNTER — Other Ambulatory Visit: Payer: Self-pay

## 2019-01-03 DIAGNOSIS — Z20822 Contact with and (suspected) exposure to covid-19: Secondary | ICD-10-CM

## 2019-01-03 NOTE — Telephone Encounter (Signed)
Exposed to [place] last night. Wife works was in [employer] with someone who tested positive and he stayed home last night from work and neither him or his wife have been tested.   Wife tired, body aches and feels just run down. Pt has headache and tired as well yesterday but feels fine today .   Advised pt to call [facility] covid line and call me back to let me know what they advise and I can send note to supervisor.

## 2019-01-03 NOTE — Telephone Encounter (Signed)
Pt called back and per [facility] he did not need to quarantine and he could go get tested if he wanted to same with his wife.   Pt states he will go get tested at [facility] just to be safe. Work note sent to supervisor stating not needed to quarantine per [facility].

## 2019-01-04 LAB — NOVEL CORONAVIRUS, NAA: SARS-CoV-2, NAA: NOT DETECTED

## 2019-01-05 ENCOUNTER — Telehealth: Payer: Self-pay | Admitting: Internal Medicine

## 2019-01-05 NOTE — Telephone Encounter (Addendum)
His results for covid were neg per mychart.  Garrett Mcpherson said that her and Garrett Mcpherson spoke with him. He has been taken care of.

## 2019-01-10 ENCOUNTER — Other Ambulatory Visit: Payer: Self-pay

## 2019-01-10 DIAGNOSIS — Z20822 Contact with and (suspected) exposure to covid-19: Secondary | ICD-10-CM

## 2019-01-10 NOTE — Telephone Encounter (Signed)
covid test neg

## 2019-01-12 LAB — NOVEL CORONAVIRUS, NAA: SARS-CoV-2, NAA: NOT DETECTED

## 2019-03-31 ENCOUNTER — Encounter: Payer: Self-pay | Admitting: Physician Assistant

## 2019-03-31 ENCOUNTER — Ambulatory Visit: Payer: Self-pay | Admitting: Physician Assistant

## 2019-03-31 ENCOUNTER — Other Ambulatory Visit: Payer: Self-pay

## 2019-03-31 VITALS — BP 120/80 | HR 74 | Temp 98.4°F | Resp 16 | Ht 74.0 in | Wt 254.0 lb

## 2019-03-31 DIAGNOSIS — Z87438 Personal history of other diseases of male genital organs: Secondary | ICD-10-CM

## 2019-03-31 NOTE — Progress Notes (Signed)
2 weeks ago "pretty large" Now he thinks maybe on the right side too, but not as noticeable. Not causing discomfor.  AMD
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
## 2019-12-09 ENCOUNTER — Other Ambulatory Visit
Admission: RE | Admit: 2019-12-09 | Discharge: 2019-12-09 | Disposition: A | Discharge status: Home / Self Care | Payer: 59 | Source: Ambulatory Visit | Attending: Internal Medicine | Admitting: Internal Medicine

## 2019-12-09 DIAGNOSIS — R072 Precordial pain: Secondary | ICD-10-CM | POA: Insufficient documentation

## 2019-12-09 LAB — FIBRIN DERIVATIVES D-DIMER (ARMC ONLY): Fibrin derivatives D-dimer (ARMC): 279.07 ng/mL (FEU) (ref 0.00–499.00)

## 2019-12-09 LAB — TROPONIN I (HIGH SENSITIVITY): Troponin I (High Sensitivity): 7 ng/L (ref <18)

## 2021-01-18 ENCOUNTER — Other Ambulatory Visit: Payer: Self-pay | Admitting: Orthopedic Surgery

## 2021-01-18 DIAGNOSIS — M542 Cervicalgia: Secondary | ICD-10-CM

## 2021-06-08 ENCOUNTER — Emergency Department (HOSPITAL_COMMUNITY)
Admission: EM | Admit: 2021-06-08 | Discharge: 2021-06-08 | Disposition: A | Discharge status: Home / Self Care | Payer: 59 | Attending: Emergency Medicine | Admitting: Emergency Medicine

## 2021-06-08 ENCOUNTER — Emergency Department (HOSPITAL_COMMUNITY): Payer: 59

## 2021-06-08 ENCOUNTER — Other Ambulatory Visit: Payer: Self-pay

## 2021-06-08 ENCOUNTER — Encounter (HOSPITAL_COMMUNITY): Payer: Self-pay

## 2021-06-08 DIAGNOSIS — M5431 Sciatica, right side: Secondary | ICD-10-CM | POA: Insufficient documentation

## 2021-06-08 DIAGNOSIS — M545 Low back pain, unspecified: Secondary | ICD-10-CM | POA: Diagnosis not present

## 2021-06-08 DIAGNOSIS — M5441 Lumbago with sciatica, right side: Secondary | ICD-10-CM | POA: Diagnosis not present

## 2021-06-08 DIAGNOSIS — M549 Dorsalgia, unspecified: Secondary | ICD-10-CM | POA: Diagnosis not present

## 2021-06-08 DIAGNOSIS — M5136 Other intervertebral disc degeneration, lumbar region: Secondary | ICD-10-CM | POA: Diagnosis not present

## 2021-06-08 MED ORDER — MELOXICAM 7.5 MG PO TABS
15.0000 mg | ORAL_TABLET | Freq: Once | ORAL | Status: AC
  Administered 2021-06-08: 15 mg via ORAL
  Filled 2021-06-08 (×2): qty 2

## 2021-06-08 MED ORDER — METHYLPREDNISOLONE 4 MG PO TBPK: ORAL_TABLET | ORAL | 0 refills | Status: DC

## 2021-06-08 MED ORDER — MELOXICAM 15 MG PO TABS: 15.0000 mg | ORAL_TABLET | Freq: Every day | ORAL | 0 refills | Status: AC

## 2021-06-08 NOTE — Discharge Instructions (Signed)
You have been diagnosed with low back pain with right sided sciatica symptoms. I have prescribed a medrol dose pack and meloxicam to help with inflammation. Refrain from taking other NSAIDs while taking the meloxicam. I recommend follow up with your orthopedic surgeon, Dr. Rolena Infante with [facility]. Return to the emergency department if symptoms worsen or if life threatening symptoms develop such as numbness in the groin or urinary incontinence

## 2021-06-08 NOTE — ED Provider Notes (Signed)
[facility] EMERGENCY DEPARTMENT Provider Note   CSN: [id number] Arrival date & time: 06/08/21  1002     History  Chief Complaint  Patient presents with   Back Pain    Garrett Mcpherson is a 41 y.o. male.  The patient presents to the emergency department this morning complaining of low back and right-sided leg pain.  He states that approximately 2 months ago he was lifting a weight at the gym and felt pain in his lower back.  Since the injury he has intermittently felt pain that runs down the lateral portion of his left leg from his thigh down to his calf.  He states that sneezing and vomiting have both made it worse during a recent viral illness.  He has been taking ibuprofen at home with minimal relief of symptoms.  The patient does have prior medical history significant for cervical spine issues treated by Dr. Shon Baton at [facility]  HPI     Home Medications Prior to Admission medications   Medication Sig Start Date End Date Taking? Authorizing Provider  ibuprofen (ADVIL,MOTRIN) 800 MG tablet Take 1 tablet (800 mg total) by mouth every 8 (eight) hours as needed for mild pain or moderate pain. Patient not taking: Reported on 03/31/2019 04/29/18   Sung Amabile, DO  Multiple Vitamin (MULTIVITAMIN) capsule Take 1 capsule by mouth daily.    [provider]      Allergies    Patient has no known allergies.    Review of Systems   Review of Systems  Constitutional:  Negative for chills and fever.  Respiratory:  Negative for shortness of breath.   Cardiovascular:  Negative for chest pain and palpitations.  Gastrointestinal:  Negative for abdominal pain.  Musculoskeletal:  Positive for arthralgias and back pain.       Low back pain and pain radiating down the right leg  Neurological:  Negative for weakness and numbness.  All other systems reviewed and are negative.  Physical Exam Updated Vital Signs BP 125/77    Pulse 80    Temp 98.1 F (36.7 C)  (Oral)    Ht 6\' 2"  (1.88 m)    Wt 117.9 kg    SpO2 98%    BMI 33.38 kg/m  Physical Exam Vitals and nursing note reviewed.  Constitutional:      General: He is not in acute distress.    Appearance: He is well-developed.  HENT:     Head: Normocephalic and atraumatic.  Eyes:     Conjunctiva/sclera: Conjunctivae normal.  Cardiovascular:     Rate and Rhythm: Normal rate and regular rhythm.  Pulmonary:     Effort: Pulmonary effort is normal. No respiratory distress.     Breath sounds: Normal breath sounds.  Abdominal:     Palpations: Abdomen is soft.  Musculoskeletal:        General: No swelling or tenderness.     Cervical back: Neck supple.  Skin:    General: Skin is warm and dry.     Capillary Refill: Capillary refill takes less than 2 seconds.  Neurological:     Mental Status: He is alert.  Psychiatric:        Mood and Affect: Mood normal.    ED Results / Procedures / Treatments   Labs (all labs ordered are listed, but only abnormal results are displayed) Labs Reviewed - No data to display  EKG None  Radiology No results found.  Procedures Procedures    Medications Ordered in  ED Medications  meloxicam (MOBIC) tablet 15 mg (has no administration in time range)    ED Course/ Medical Decision Making/ A&P                           Medical Decision Making Amount and/or Complexity of Data Reviewed Radiology: ordered.  Risk Prescription drug management.   This patient presents to the ED for concern of low back pain, this involves an extensive number of treatment options, and is a complaint that carries with it a high risk of complications and morbidity.  The differential diagnosis includes but is not limited to herniated disc, lumbar strain,  spinal stenosis, or fracture   Co morbidities that complicate the patient evaluation  None   Additional history obtained:  Additional history obtained from wife External records from outside source obtained and  reviewed including review of encounters using care everywhere    Lab Tests:  None   Imaging Studies ordered:  I ordered imaging studies including plain radiographs of the lumbar spine  I independently visualized and interpreted imaging which showed moderate narrowing of L5-S1 and mild narrowing of L4-L5 I agree with the radiologist interpretation    Medicines ordered and prescription drug management:  I ordered medication including meloxicam  for inflammation  Reevaluation of the patient after these medicines showed that the patient improved I have reviewed the patients home medicines and have made adjustments as needed   Test Considered:  MR lumbar spine   Critical Interventions:  None   Consultations Obtained:  None     Reevaluation:  After the interventions noted above, I reevaluated the patient and found that they have :improved      Dispostion:  After consideration of the diagnostic results and the patients response to treatment, I feel that the patent would benefit from discharge home. After considering patient presentation, history, and imaging, I feel that he can follow up outpatient.  He is currently a patient of Dr.Brooks and I would recommend getting an appointment there. I will provide a medrol dose pak and meloxicam. Return precautions given including saddle anesthesia and incontinence   Final Clinical Impression(s) / ED Diagnoses Final diagnoses:  None    Rx / DC Orders ED Discharge Orders     None         Darrick Grinder, [place] 06/08/21 1208    Melene Plan, DO 06/08/21 1210

## 2021-06-08 NOTE — ED Notes (Signed)
Patient transported to X-ray 

## 2021-06-08 NOTE — ED Triage Notes (Signed)
Pt BIB POV d/t back pain that started about 2 months ago. It initially started as a sharp pain & about two weeks ago he had a stomach bug & stated that after vomiting & sneezing he back is "blew out." Pt endorses having seen a chiropractor & that helped but now his pain is down his Rt leg.

## 2021-06-11 DIAGNOSIS — M5441 Lumbago with sciatica, right side: Secondary | ICD-10-CM | POA: Diagnosis not present

## 2021-06-11 DIAGNOSIS — M5136 Other intervertebral disc degeneration, lumbar region: Secondary | ICD-10-CM | POA: Diagnosis not present

## 2021-06-11 DIAGNOSIS — M5416 Radiculopathy, lumbar region: Secondary | ICD-10-CM | POA: Diagnosis not present

## 2021-06-11 DIAGNOSIS — G8929 Other chronic pain: Secondary | ICD-10-CM | POA: Diagnosis not present

## 2021-06-22 ENCOUNTER — Emergency Department: Payer: 59

## 2021-06-22 ENCOUNTER — Emergency Department
Admission: EM | Admit: 2021-06-22 | Discharge: 2021-06-22 | Disposition: A | Discharge status: Home / Self Care | Payer: 59 | Attending: Emergency Medicine | Admitting: Emergency Medicine

## 2021-06-22 ENCOUNTER — Encounter: Payer: Self-pay | Admitting: Emergency Medicine

## 2021-06-22 ENCOUNTER — Other Ambulatory Visit: Payer: Self-pay

## 2021-06-22 DIAGNOSIS — M4802 Spinal stenosis, cervical region: Secondary | ICD-10-CM | POA: Diagnosis not present

## 2021-06-22 DIAGNOSIS — R208 Other disturbances of skin sensation: Secondary | ICD-10-CM | POA: Diagnosis not present

## 2021-06-22 DIAGNOSIS — R531 Weakness: Secondary | ICD-10-CM

## 2021-06-22 DIAGNOSIS — M5416 Radiculopathy, lumbar region: Secondary | ICD-10-CM | POA: Diagnosis not present

## 2021-06-22 DIAGNOSIS — M2578 Osteophyte, vertebrae: Secondary | ICD-10-CM | POA: Diagnosis not present

## 2021-06-22 DIAGNOSIS — M47816 Spondylosis without myelopathy or radiculopathy, lumbar region: Secondary | ICD-10-CM | POA: Diagnosis not present

## 2021-06-22 DIAGNOSIS — R202 Paresthesia of skin: Secondary | ICD-10-CM | POA: Diagnosis not present

## 2021-06-22 DIAGNOSIS — R200 Anesthesia of skin: Secondary | ICD-10-CM | POA: Diagnosis not present

## 2021-06-22 DIAGNOSIS — M47817 Spondylosis without myelopathy or radiculopathy, lumbosacral region: Secondary | ICD-10-CM | POA: Diagnosis not present

## 2021-06-22 MED ORDER — PREDNISONE 20 MG PO TABS
60.0000 mg | ORAL_TABLET | Freq: Once | ORAL | Status: AC
  Administered 2021-06-22: 60 mg via ORAL
  Filled 2021-06-22: qty 3

## 2021-06-22 MED ORDER — KETOROLAC TROMETHAMINE 30 MG/ML IJ SOLN
30.0000 mg | Freq: Once | INTRAMUSCULAR | Status: AC
  Administered 2021-06-22: 30 mg via INTRAMUSCULAR
  Filled 2021-06-22: qty 1

## 2021-06-22 MED ORDER — LORAZEPAM 0.5 MG PO TABS
0.5000 mg | ORAL_TABLET | Freq: Once | ORAL | Status: DC
  Filled 2021-06-22: qty 1

## 2021-06-22 MED ORDER — OXYCODONE-ACETAMINOPHEN 5-325 MG PO TABS: 1.0000 | ORAL_TABLET | Freq: Four times a day (QID) | ORAL | 0 refills | Status: AC | PRN

## 2021-06-22 MED ORDER — PREDNISONE 10 MG (21) PO TBPK: ORAL_TABLET | ORAL | 0 refills | Status: DC

## 2021-06-22 MED ORDER — ACETAMINOPHEN 325 MG PO TABS
650.0000 mg | ORAL_TABLET | Freq: Once | ORAL | Status: AC
  Administered 2021-06-22: 650 mg via ORAL
  Filled 2021-06-22: qty 2

## 2021-06-22 NOTE — ED Provider Notes (Signed)
[facility] Provider Note  Patient Contact: 11:20 PM (approximate)   History   Foot Pain   HPI  Garrett Mcpherson is a 41 y.o. male with a history of neck pain presents to the emergency department with loss of sensation of the right great toe as well as the second and third right toes.  Patient describes pain and a burning sensation that radiates down the right lower extremity.  He states that he has been tripping at home and has had a gait disturbance that requires a cane.  He states that he is only had to use a cane for the past 24 hours.  He also endorses tingling in his hands and numbness along the groin.  He denies bowel or bladder incontinence.  He denies new falls or mechanisms of trauma.  He denies fever and chills at home.      Physical Exam   Triage Vital Signs: ED Triage Vitals  Enc Vitals Group     BP 06/22/21 2020 (!) 149/98     Pulse Rate 06/22/21 2020 81     Resp 06/22/21 2020 16     Temp 06/22/21 2017 98 F (36.7 C)     Temp src --      SpO2 06/22/21 2020 94 %     Weight 06/22/21 2017 250 lb (113.4 kg)     Height 06/22/21 2017 6\' 2"  (1.88 m)     Head Circumference --      Peak Flow --      Pain Score 06/22/21 2021 0     Pain Loc --      Pain Edu? --      Excl. in GC? --     Most recent vital signs: Vitals:   06/22/21 2020 06/22/21 2314  BP: (!) 149/98 (!) 154/95  Pulse: 81 78  Resp: 16 18  Temp:    SpO2: 94% 98%     General: Alert and in no acute distress. Eyes:  PERRL. EOMI. Head: No acute traumatic findings ENT:      Nose: No congestion/rhinnorhea.      Mouth/Throat: Mucous membranes are moist. Neck: Patient has difficulty performing extension at the neck.  Cardiovascular:  Good peripheral perfusion Respiratory: Normal respiratory effort without tachypnea or retractions. Lungs CTAB. Good air entry to the bases with no decreased or absent breath sounds. Gastrointestinal: Bowel sounds 4 quadrants. Soft and  nontender to palpation. No guarding or rigidity. No palpable masses. No distention. No CVA tenderness. Musculoskeletal: Patient has weakness of the right great toe.  Patient has weakness with resisted dorsiflexion Neurologic: Patient has diminished sensation along the dorsal aspect of the right first, second and third toes.  No gross focal neurologic deficits are appreciated.  Skin:   No rash noted Other:   ED Results / Procedures / Treatments   Labs (all labs ordered are listed, but only abnormal results are displayed) Labs Reviewed - No data to display     RADIOLOGY  I personally viewed and evaluated these images as part of my medical decision making, as well as reviewing the written report by the radiologist.  ED Provider Interpretation:   I personally reviewed MRIs of the lumbar and cervical spine.  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                              Assessment and plan Lumbar radiculopathy Weakness Leg pain 41 year old male presents to the emergency department with tingling in the hands as well as right lower extremity pain and weakness of the 1-3 right toes.  Patient also endorsed saddle anesthesia.  Patient was hypertensive at triage but vital signs were otherwise reassuring.  On exam, patient had weakness of the right great toe and difficulty performing resisted  dorsi flexion.  He could ambulate unassisted with a cane.  MRIs of the lumbar and cervical spine were obtained given symptoms detailed above.  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 Patient has mild protrusion at C4-C5 with resultant mild spinal stenosis and mild cord flattening.  I reached out to neurosurgeon on-call, Dr. Adriana Simas who was concerned about large nature at disc extrusion at L4-L5 and anticipates seeing patient in his [facility] office on Tuesday as an outpatient with anticipation of surgery this week.  Dr. Adriana Simas requested tapered prednisone at discharge.  I also prescribed patient a short course of Percocet until he can be seen by neurosurgery.  He was cautioned to return to the emergency department with worsening pain, weakness or bowel or bladder incontinence.  He voiced understanding and has easy access to the emergency department should his symptoms change or worsen.   FINAL CLINICAL IMPRESSION(S) / ED DIAGNOSES   Final diagnoses:  Weakness  Numbness     Rx / DC Orders   ED Discharge Orders          Ordered    predniSONE (STERAPRED UNI-PAK 21 TAB) 10 MG (21) TBPK tablet        06/22/21 2308    oxyCODONE-acetaminophen (PERCOCET/ROXICET) 5-325 MG tablet  Every 6 hours PRN        06/22/21 2308             Note:  This document was prepared using Dragon voice recognition software and may include unintentional dictation errors.   Pia Mau [place], Cordelia Poche 06/22/21 2333    Minna Antis, MD 06/23/21 2203

## 2021-06-22 NOTE — ED Notes (Signed)
NAD noted at time of D/C. Pt denies questions or concerns. Pt ambulatory to the lobby at this time. Verbal consent for D/C obtained at this time.  

## 2021-06-22 NOTE — ED Triage Notes (Signed)
Pt presents to ER with numbness to right foot. Pt reports he has sciatic problems and today his foot went numb, reports he saw his doctor in [facility] today and recommended to come to ER since it is a sudden change. Pt reports he is not able to lift  his toes. Pt talks in complete sentences no respiratory distress noted

## 2021-06-22 NOTE — Discharge Instructions (Addendum)
Take tapered steroid as directed.  If you do not hear from Dr. Lacinda Axon by Monday afternoon at 12:00 PM, please call his office. Dr. Lacinda Axon should be able to fit you into his schedule on Tuesday morning.

## 2021-06-25 ENCOUNTER — Other Ambulatory Visit: Payer: Self-pay

## 2021-06-25 ENCOUNTER — Other Ambulatory Visit: Payer: Self-pay | Admitting: Neurosurgery

## 2021-06-25 ENCOUNTER — Encounter: Payer: Self-pay | Admitting: Neurosurgery

## 2021-06-25 ENCOUNTER — Other Ambulatory Visit
Admission: RE | Admit: 2021-06-25 | Discharge: 2021-06-25 | Disposition: A | Discharge status: Home / Self Care | Payer: 59 | Source: Ambulatory Visit | Attending: Neurosurgery | Admitting: Neurosurgery

## 2021-06-25 DIAGNOSIS — Z01812 Encounter for preprocedural laboratory examination: Secondary | ICD-10-CM | POA: Diagnosis not present

## 2021-06-25 DIAGNOSIS — Z01818 Encounter for other preprocedural examination: Secondary | ICD-10-CM

## 2021-06-25 LAB — BASIC METABOLIC PANEL
Anion gap: 7 (ref 5–15)
BUN: 14 mg/dL (ref 6–20)
CO2: 27 mmol/L (ref 22–32)
Calcium: 9.2 mg/dL (ref 8.9–10.3)
Chloride: 105 mmol/L (ref 98–111)
Creatinine, Ser: 0.89 mg/dL (ref 0.61–1.24)
GFR, Estimated: >60 mL/min (ref >60)
Glucose, Bld: 93 mg/dL (ref 70–99)
Potassium: 3.9 mmol/L (ref 3.5–5.1)
Sodium: 139 mmol/L (ref 135–145)

## 2021-06-25 LAB — CBC
HCT: 50.5 % (ref 39.0–52.0)
Hemoglobin: 16.6 g/dL (ref 13.0–17.0)
MCH: 28.9 pg (ref 26.0–34.0)
MCHC: 32.9 g/dL (ref 30.0–36.0)
MCV: 88 fL (ref 80.0–100.0)
Platelets: 235 10*3/uL (ref 150–400)
RBC: 5.74 MIL/uL (ref 4.22–5.81)
RDW: 13.6 % (ref 11.5–15.5)
WBC: 15.6 10*3/uL — ABNORMAL HIGH (ref 4.0–10.5)
nRBC: 0 % (ref 0.0–0.2)

## 2021-06-25 LAB — URINALYSIS, ROUTINE W REFLEX MICROSCOPIC
Bilirubin Urine: NEGATIVE
Glucose, UA: NEGATIVE mg/dL
Hgb urine dipstick: NEGATIVE
Ketones, ur: NEGATIVE mg/dL
Leukocytes,Ua: NEGATIVE
Nitrite: NEGATIVE
Protein, ur: NEGATIVE mg/dL
Specific Gravity, Urine: 1.014 (ref 1.005–1.030)
pH: 7 (ref 5.0–8.0)

## 2021-06-25 LAB — SURGICAL PCR SCREEN
MRSA, PCR: NEGATIVE
Staphylococcus aureus: POSITIVE — AB

## 2021-06-25 LAB — TYPE AND SCREEN
ABO/RH(D): O POS
Antibody Screen: NEGATIVE

## 2021-06-25 NOTE — Patient Instructions (Addendum)
Your procedure is scheduled on: Wednesday, February 15 Report to the Registration Desk on the 1st floor of the [facility] To find out your arrival time, please call [phone] between 1PM - 3PM on: Tuesday, February 14  REMEMBER: Instructions that are not followed completely may result in serious medical risk, up to and including death; or upon the discretion of your surgeon and anesthesiologist your surgery may need to be rescheduled.  Do not eat food after midnight the night before surgery.  No gum chewing, lozengers or hard candies.  You may however, drink CLEAR liquids up to 2 hours before you are scheduled to arrive for your surgery. Do not drink anything within 2 hours of your scheduled arrival time.  Clear liquids include: - water  - apple juice without pulp - gatorade (not RED, PURPLE, OR BLUE) - black coffee or tea (Do NOT add milk or creamers to the coffee or tea) Do NOT drink anything that is not on this list.  TAKE THESE MEDICATIONS THE MORNING OF SURGERY WITH A SIP OF WATER:  Prednisone Oxycodone if needed for pain  One week prior to surgery: Stop meloxicam and Anti-inflammatories (NSAIDS) such as Advil, Aleve, Ibuprofen, Motrin, Naproxen, Naprosyn and Aspirin based products such as Excedrin, Goodys Powder, BC Powder. Stop ANY OVER THE COUNTER supplements until after surgery. Stop multiple vitamin You may however, continue to take Tylenol if needed for pain up until the day of surgery.  No Alcohol for 24 hours before or after surgery.  No Smoking including e-cigarettes for 24 hours prior to surgery.  No chewable tobacco products for at least 6 hours prior to surgery.  No nicotine patches on the day of surgery.  Do not use any "recreational" drugs for at least a week prior to your surgery.  Please be advised that the combination of cocaine and anesthesia may have negative outcomes, up to and including death. If you test positive for cocaine, your surgery will  be cancelled.  On the morning of surgery brush your teeth with toothpaste and water, you may rinse your mouth with mouthwash if you wish. Do not swallow any toothpaste or mouthwash.  Use CHG Soap as directed on instruction sheet.  Do not wear jewelry, make-up, hairpins, clips or nail polish.  Do not wear lotions, powders, or perfumes.   Do not shave body from the neck down 48 hours prior to surgery just in case you cut yourself which could leave a site for infection.  Also, freshly shaved skin may become irritated if using the CHG soap.  Contact lenses, hearing aids and dentures may not be worn into surgery.  Do not bring valuables to the hospital. [facility] is not responsible for any missing/lost belongings or valuables.   Notify your doctor if there is any change in your medical condition (cold, fever, infection).  Wear comfortable clothing (specific to your surgery type) to the hospital.  After surgery, you can help prevent lung complications by doing breathing exercises.  Take deep breaths and cough every 1-2 hours. Your doctor may order a device called an Incentive Spirometer to help you take deep breaths.  If you are being discharged the day of surgery, you will not be allowed to drive home. You will need a responsible adult (18 years or older) to drive you home and stay with you that night.   If you are taking public transportation, you will need to have a responsible adult (18 years or older) with you. Please confirm  with your physician that it is acceptable to use public transportation.   Please call the Pre-admissions Testing Dept. at [phone] if you have any questions about these instructions.  Surgery Visitation Policy:  Patients undergoing a surgery or procedure may have one family member or support person with them as long as that person is not COVID-19 positive or experiencing its symptoms.  That person may remain in the waiting area during the procedure and  may rotate out with other people.

## 2021-06-26 ENCOUNTER — Encounter: Payer: Self-pay | Admitting: Neurosurgery

## 2021-06-26 ENCOUNTER — Other Ambulatory Visit: Payer: Self-pay

## 2021-06-26 ENCOUNTER — Ambulatory Visit: Payer: 59

## 2021-06-26 ENCOUNTER — Encounter
Admission: RE | Disposition: A | Discharge status: Home / Self Care | Payer: Self-pay | Source: Home / Self Care | Attending: Neurosurgery

## 2021-06-26 ENCOUNTER — Ambulatory Visit: Payer: 59 | Admitting: Urgent Care

## 2021-06-26 ENCOUNTER — Ambulatory Visit: Payer: 59 | Admitting: Anesthesiology

## 2021-06-26 ENCOUNTER — Ambulatory Visit
Admission: RE | Admit: 2021-06-26 | Discharge: 2021-06-26 | Disposition: A | Discharge status: Home / Self Care | Payer: 59 | Attending: Neurosurgery | Admitting: Neurosurgery

## 2021-06-26 DIAGNOSIS — Z79891 Long term (current) use of opiate analgesic: Secondary | ICD-10-CM | POA: Insufficient documentation

## 2021-06-26 DIAGNOSIS — Z981 Arthrodesis status: Secondary | ICD-10-CM | POA: Diagnosis not present

## 2021-06-26 DIAGNOSIS — M5116 Intervertebral disc disorders with radiculopathy, lumbar region: Secondary | ICD-10-CM | POA: Insufficient documentation

## 2021-06-26 DIAGNOSIS — Z419 Encounter for procedure for purposes other than remedying health state, unspecified: Secondary | ICD-10-CM

## 2021-06-26 DIAGNOSIS — M21371 Foot drop, right foot: Secondary | ICD-10-CM | POA: Insufficient documentation

## 2021-06-26 DIAGNOSIS — R29898 Other symptoms and signs involving the musculoskeletal system: Secondary | ICD-10-CM | POA: Insufficient documentation

## 2021-06-26 DIAGNOSIS — Z791 Long term (current) use of non-steroidal anti-inflammatories (NSAID): Secondary | ICD-10-CM | POA: Diagnosis not present

## 2021-06-26 DIAGNOSIS — Z79899 Other long term (current) drug therapy: Secondary | ICD-10-CM | POA: Diagnosis not present

## 2021-06-26 DIAGNOSIS — M5416 Radiculopathy, lumbar region: Secondary | ICD-10-CM | POA: Diagnosis not present

## 2021-06-26 HISTORY — PX: LUMBAR LAMINECTOMY/DECOMPRESSION MICRODISCECTOMY: SHX5026

## 2021-06-26 HISTORY — DX: Radiculopathy, lumbar region: M54.16

## 2021-06-26 LAB — ABO/RH: ABO/RH(D): O POS

## 2021-06-26 SURGERY — LUMBAR LAMINECTOMY/DECOMPRESSION MICRODISCECTOMY 1 LEVEL
Anesthesia: General | Site: Spine Lumbar | Laterality: Right

## 2021-06-26 MED ORDER — DEXAMETHASONE SODIUM PHOSPHATE 10 MG/ML IJ SOLN
INTRAMUSCULAR | Status: DC | PRN
  Administered 2021-06-26: 10 mg via INTRAVENOUS

## 2021-06-26 MED ORDER — FAMOTIDINE 20 MG PO TABS
ORAL_TABLET | ORAL | Status: AC
  Administered 2021-06-26: 20 mg via ORAL
  Filled 2021-06-26: qty 1

## 2021-06-26 MED ORDER — SODIUM CHLORIDE (PF) 0.9 % IJ SOLN
INTRAMUSCULAR | Status: DC | PRN
  Administered 2021-06-26: 60 mL

## 2021-06-26 MED ORDER — DEXMEDETOMIDINE (PRECEDEX) IN NS 20 MCG/5ML (4 MCG/ML) IV SYRINGE
PREFILLED_SYRINGE | INTRAVENOUS | Status: DC | PRN
  Administered 2021-06-26: 8 ug via INTRAVENOUS
  Administered 2021-06-26 (×3): 4 ug via INTRAVENOUS

## 2021-06-26 MED ORDER — CEFAZOLIN SODIUM-DEXTROSE 2-4 GM/100ML-% IV SOLN
2.0000 g | INTRAVENOUS | Status: AC
  Administered 2021-06-26: 2 g via INTRAVENOUS

## 2021-06-26 MED ORDER — BUPIVACAINE LIPOSOME 1.3 % IJ SUSP
INTRAMUSCULAR | Status: AC
  Filled 2021-06-26: qty 20

## 2021-06-26 MED ORDER — BUPIVACAINE HCL (PF) 0.5 % IJ SOLN
INTRAMUSCULAR | Status: AC
  Filled 2021-06-26: qty 30

## 2021-06-26 MED ORDER — ONDANSETRON HCL 4 MG/2ML IJ SOLN
INTRAMUSCULAR | Status: DC | PRN
  Administered 2021-06-26: 4 mg via INTRAVENOUS

## 2021-06-26 MED ORDER — ONDANSETRON HCL 4 MG/2ML IJ SOLN
INTRAMUSCULAR | Status: AC
  Filled 2021-06-26: qty 2

## 2021-06-26 MED ORDER — MIDAZOLAM HCL 2 MG/2ML IJ SOLN
INTRAMUSCULAR | Status: AC
  Filled 2021-06-26: qty 2

## 2021-06-26 MED ORDER — ORAL CARE MOUTH RINSE: 15.0000 mL | Freq: Once | OROMUCOSAL | Status: AC

## 2021-06-26 MED ORDER — SENNA 8.6 MG PO TABS: 1.0000 | ORAL_TABLET | Freq: Every day | ORAL | 0 refills | Status: DC | PRN

## 2021-06-26 MED ORDER — PROPOFOL 10 MG/ML IV BOLUS
INTRAVENOUS | Status: AC
  Filled 2021-06-26: qty 20

## 2021-06-26 MED ORDER — SURGIFLO WITH THROMBIN (HEMOSTATIC MATRIX KIT) OPTIME
TOPICAL | Status: DC | PRN
  Administered 2021-06-26: 1 via TOPICAL

## 2021-06-26 MED ORDER — LACTATED RINGERS IV SOLN: INTRAVENOUS | Status: DC

## 2021-06-26 MED ORDER — ONDANSETRON HCL 4 MG/2ML IJ SOLN: 4.0000 mg | Freq: Once | INTRAMUSCULAR | Status: DC | PRN

## 2021-06-26 MED ORDER — SUCCINYLCHOLINE CHLORIDE 200 MG/10ML IV SOSY
PREFILLED_SYRINGE | INTRAVENOUS | Status: DC | PRN
  Administered 2021-06-26: 120 mg via INTRAVENOUS

## 2021-06-26 MED ORDER — 0.9 % SODIUM CHLORIDE (POUR BTL) OPTIME
TOPICAL | Status: DC | PRN
  Administered 2021-06-26: 50 mL

## 2021-06-26 MED ORDER — LIDOCAINE HCL (PF) 2 % IJ SOLN
INTRAMUSCULAR | Status: AC
  Filled 2021-06-26: qty 5

## 2021-06-26 MED ORDER — FAMOTIDINE 20 MG PO TABS: 20.0000 mg | ORAL_TABLET | Freq: Once | ORAL | Status: AC

## 2021-06-26 MED ORDER — GLYCOPYRROLATE 0.2 MG/ML IJ SOLN
INTRAMUSCULAR | Status: AC
  Filled 2021-06-26: qty 1

## 2021-06-26 MED ORDER — CEFAZOLIN SODIUM-DEXTROSE 2-4 GM/100ML-% IV SOLN
INTRAVENOUS | Status: AC
  Filled 2021-06-26: qty 100

## 2021-06-26 MED ORDER — SODIUM CHLORIDE FLUSH 0.9 % IV SOLN
INTRAVENOUS | Status: AC
  Filled 2021-06-26: qty 20

## 2021-06-26 MED ORDER — METHYLPREDNISOLONE ACETATE 40 MG/ML IJ SUSP
INTRAMUSCULAR | Status: DC | PRN
Start: 2021-06-26 — End: 2021-06-26
  Administered 2021-06-26: 40 mg via INTRAMUSCULAR

## 2021-06-26 MED ORDER — PROPOFOL 10 MG/ML IV BOLUS
INTRAVENOUS | Status: DC | PRN
  Administered 2021-06-26: 200 mg via INTRAVENOUS

## 2021-06-26 MED ORDER — METHYLPREDNISOLONE ACETATE 40 MG/ML IJ SUSP
INTRAMUSCULAR | Status: AC
  Filled 2021-06-26: qty 1

## 2021-06-26 MED ORDER — FENTANYL CITRATE (PF) 100 MCG/2ML IJ SOLN: 25.0000 ug | INTRAMUSCULAR | Status: DC | PRN

## 2021-06-26 MED ORDER — FENTANYL CITRATE (PF) 100 MCG/2ML IJ SOLN
INTRAMUSCULAR | Status: AC
  Filled 2021-06-26: qty 2

## 2021-06-26 MED ORDER — CHLORHEXIDINE GLUCONATE 0.12 % MT SOLN
OROMUCOSAL | Status: AC
  Administered 2021-06-26: 15 mL via OROMUCOSAL
  Filled 2021-06-26: qty 15

## 2021-06-26 MED ORDER — KETAMINE HCL 10 MG/ML IJ SOLN
INTRAMUSCULAR | Status: DC | PRN
  Administered 2021-06-26 (×2): 10 mg via INTRAVENOUS
  Administered 2021-06-26: 30 mg via INTRAVENOUS

## 2021-06-26 MED ORDER — MIDAZOLAM HCL 2 MG/2ML IJ SOLN
INTRAMUSCULAR | Status: DC | PRN
  Administered 2021-06-26: 2 mg via INTRAVENOUS

## 2021-06-26 MED ORDER — KETAMINE HCL 50 MG/5ML IJ SOSY
PREFILLED_SYRINGE | INTRAMUSCULAR | Status: AC
  Filled 2021-06-26: qty 5

## 2021-06-26 MED ORDER — METHOCARBAMOL 500 MG PO TABS: 500.0000 mg | ORAL_TABLET | Freq: Four times a day (QID) | ORAL | 0 refills | Status: DC

## 2021-06-26 MED ORDER — DEXAMETHASONE SODIUM PHOSPHATE 10 MG/ML IJ SOLN
INTRAMUSCULAR | Status: AC
  Filled 2021-06-26: qty 1

## 2021-06-26 MED ORDER — PROPOFOL 1000 MG/100ML IV EMUL
INTRAVENOUS | Status: AC
  Filled 2021-06-26: qty 100

## 2021-06-26 MED ORDER — LIDOCAINE HCL (CARDIAC) PF 100 MG/5ML IV SOSY
PREFILLED_SYRINGE | INTRAVENOUS | Status: DC | PRN
  Administered 2021-06-26: 100 mg via INTRAVENOUS

## 2021-06-26 MED ORDER — DEXMEDETOMIDINE (PRECEDEX) IN NS 20 MCG/5ML (4 MCG/ML) IV SYRINGE
PREFILLED_SYRINGE | INTRAVENOUS | Status: AC
  Filled 2021-06-26: qty 5

## 2021-06-26 MED ORDER — GLYCOPYRROLATE 0.2 MG/ML IJ SOLN
INTRAMUSCULAR | Status: DC | PRN
  Administered 2021-06-26: .1 mg via INTRAVENOUS

## 2021-06-26 MED ORDER — BUPIVACAINE-EPINEPHRINE (PF) 0.5% -1:200000 IJ SOLN
INTRAMUSCULAR | Status: AC
  Filled 2021-06-26: qty 30

## 2021-06-26 MED ORDER — SUCCINYLCHOLINE CHLORIDE 200 MG/10ML IV SOSY
PREFILLED_SYRINGE | INTRAVENOUS | Status: AC
  Filled 2021-06-26: qty 10

## 2021-06-26 MED ORDER — FENTANYL CITRATE (PF) 100 MCG/2ML IJ SOLN
INTRAMUSCULAR | Status: DC | PRN
  Administered 2021-06-26: 100 ug via INTRAVENOUS

## 2021-06-26 MED ORDER — OXYCODONE-ACETAMINOPHEN 5-325 MG PO TABS: 1.0000 | ORAL_TABLET | ORAL | 0 refills | Status: AC | PRN

## 2021-06-26 MED ORDER — CHLORHEXIDINE GLUCONATE 0.12 % MT SOLN: 15.0000 mL | Freq: Once | OROMUCOSAL | Status: AC

## 2021-06-26 MED ORDER — BUPIVACAINE-EPINEPHRINE (PF) 0.5% -1:200000 IJ SOLN
INTRAMUSCULAR | Status: DC | PRN
  Administered 2021-06-26: 2 mL

## 2021-06-26 SURGICAL SUPPLY — 57 items
ADH SKN CLS APL DERMABOND .7 (GAUZE/BANDAGES/DRESSINGS) ×1
AGENT HMST KT MTR STRL THRMB (HEMOSTASIS) ×1
APL PRP STRL LF DISP 70% ISPRP (MISCELLANEOUS) ×2
BUR NEURO DRILL SOFT 3.0X3.8M (BURR) ×2 IMPLANT
CHLORAPREP W/TINT 26 (MISCELLANEOUS) ×4 IMPLANT
CNTNR SPEC 2.5X3XGRAD LEK (MISCELLANEOUS) ×1
CONT SPEC 4OZ STER OR WHT (MISCELLANEOUS) ×1
CONT SPEC 4OZ STRL OR WHT (MISCELLANEOUS) ×1
CONTAINER SPEC 2.5X3XGRAD LEK (MISCELLANEOUS) ×1 IMPLANT
COUNTER NEEDLE 20/40 LG (NEEDLE) ×2 IMPLANT
CUP MEDICINE 2OZ PLAST GRAD ST (MISCELLANEOUS) ×4 IMPLANT
DERMABOND ADVANCED (GAUZE/BANDAGES/DRESSINGS) ×1
DERMABOND ADVANCED .7 DNX12 (GAUZE/BANDAGES/DRESSINGS) ×1 IMPLANT
DRAPE C ARM PK CFD 31 SPINE (DRAPES) ×2 IMPLANT
DRAPE LAPAROTOMY 100X77 ABD (DRAPES) ×2 IMPLANT
DRAPE MICROSCOPE SPINE 48X150 (DRAPES) ×2 IMPLANT
DRAPE SURG 17X11 SM STRL (DRAPES) ×2 IMPLANT
DRSG OPSITE POSTOP 3X4 (GAUZE/BANDAGES/DRESSINGS) ×1 IMPLANT
ELECT CAUTERY BLADE TIP 2.5 (TIP) ×2
ELECT EZSTD 165MM 6.5IN (MISCELLANEOUS)
ELECT REM PT RETURN 9FT ADLT (ELECTROSURGICAL) ×2
ELECTRODE CAUTERY BLDE TIP 2.5 (TIP) ×1 IMPLANT
ELECTRODE EZSTD 165MM 6.5IN (MISCELLANEOUS) IMPLANT
ELECTRODE REM PT RTRN 9FT ADLT (ELECTROSURGICAL) ×1 IMPLANT
GAUZE 4X4 16PLY ~~LOC~~+RFID DBL (SPONGE) ×2 IMPLANT
GLOVE SURG SYN 6.5 ES PF (GLOVE) ×4 IMPLANT
GLOVE SURG SYN 6.5 PF PI (GLOVE) ×2 IMPLANT
GLOVE SURG SYN 8.5  E (GLOVE) ×6
GLOVE SURG SYN 8.5 E (GLOVE) ×3 IMPLANT
GLOVE SURG SYN 8.5 PF PI (GLOVE) ×3 IMPLANT
GLOVE SURG UNDER POLY LF SZ6.5 (GLOVE) ×2 IMPLANT
GOWN SRG LRG LVL 4 IMPRV REINF (GOWNS) ×1 IMPLANT
GOWN SRG XL LVL 3 NONREINFORCE (GOWNS) ×1 IMPLANT
GOWN STRL NON-REIN TWL XL LVL3 (GOWNS) ×2
GOWN STRL REIN LRG LVL4 (GOWNS) ×2
GRADUATE 1200CC STRL 31836 (MISCELLANEOUS) ×2 IMPLANT
KIT SPINAL PRONEVIEW (KITS) ×2 IMPLANT
MANIFOLD NEPTUNE II (INSTRUMENTS) ×2 IMPLANT
MARKER SKIN DUAL TIP RULER LAB (MISCELLANEOUS) ×4 IMPLANT
NDL SAFETY ECLIPSE 18X1.5 (NEEDLE) ×1 IMPLANT
NEEDLE HYPO 18GX1.5 SHARP (NEEDLE) ×2
NEEDLE HYPO 22GX1.5 SAFETY (NEEDLE) ×2 IMPLANT
NS IRRIG 1000ML POUR BTL (IV SOLUTION) ×2 IMPLANT
NS IRRIG 500ML POUR BTL (IV SOLUTION) ×1 IMPLANT
PACK LAMINECTOMY NEURO (CUSTOM PROCEDURE TRAY) ×2 IMPLANT
PAD ARMBOARD 7.5X6 YLW CONV (MISCELLANEOUS) ×2 IMPLANT
SURGIFLO W/THROMBIN 8M KIT (HEMOSTASIS) ×2 IMPLANT
SUT DVC VLOC 3-0 CL 6 P-12 (SUTURE) ×2 IMPLANT
SUT VIC AB 0 CT1 27 (SUTURE) ×2
SUT VIC AB 0 CT1 27XCR 8 STRN (SUTURE) ×1 IMPLANT
SUT VIC AB 2-0 CT1 18 (SUTURE) ×2 IMPLANT
SYR 10ML LL (SYRINGE) ×2 IMPLANT
SYR 20ML LL LF (SYRINGE) ×2 IMPLANT
SYR 30ML LL (SYRINGE) ×4 IMPLANT
SYR 3ML LL SCALE MARK (SYRINGE) ×2 IMPLANT
TOWEL OR 17X26 4PK STRL BLUE (TOWEL DISPOSABLE) ×6 IMPLANT
TUBING CONNECTING 10 (TUBING) ×2 IMPLANT

## 2021-06-26 NOTE — Anesthesia Postprocedure Evaluation (Signed)
Anesthesia Post Note  Patient: Garrett Mcpherson  Procedure(s) Performed: RIGHT L4-5 MICRODISCECTOMY (Right: Spine Lumbar)  Patient location during evaluation: PACU Anesthesia Type: General Level of consciousness: awake, awake and alert and oriented Pain management: satisfactory to patient Vital Signs Assessment: post-procedure vital signs reviewed and stable Respiratory status: spontaneous breathing and nonlabored ventilation Cardiovascular status: stable Anesthetic complications: no   No notable events documented.   Last Vitals:  Vitals:   06/26/21 1239 06/26/21 1245  BP:  (!) 147/93  Pulse: 77 72  Resp: 14 19  Temp:    SpO2: 100% 99%    Last Pain:  Vitals:   06/26/21 1245  TempSrc:   PainSc: 0-No pain                 VAN STAVEREN,Cana Mignano

## 2021-06-26 NOTE — H&P (Signed)
I have reviewed and confirmed my history and physical from 06/25/2021 with no additions or changes. Plan for R L4-5 microdiscectomy.  Risks and benefits reviewed.  Heart sounds normal no MRG. Chest Clear to Auscultation Bilaterally.

## 2021-06-26 NOTE — Anesthesia Procedure Notes (Signed)
Procedure Name: Intubation Date/Time: 06/26/2021 11:13 AM Performed by: Cammie Sickle, CRNA Pre-anesthesia Checklist: Patient identified, Emergency Drugs available, Suction available and Patient being monitored Patient Re-evaluated:Patient Re-evaluated prior to induction Oxygen Delivery Method: Circle system utilized Preoxygenation: Pre-oxygenation with 100% oxygen Induction Type: IV induction Ventilation: Mask ventilation without difficulty Laryngoscope Size: McGraph and 3 Grade View: Grade I Tube type: Oral Tube size: 7.5 mm Number of attempts: 1 Airway Equipment and Method: Stylet and Oral airway Placement Confirmation: ETT inserted through vocal cords under direct vision, positive ETCO2 and breath sounds checked- equal and bilateral Secured at: 22 cm Tube secured with: Tape Dental Injury: Teeth and Oropharynx as per pre-operative assessment  Comments: Pt. Intubated in his stretcher

## 2021-06-26 NOTE — Progress Notes (Signed)
Disc fragment

## 2021-06-26 NOTE — Anesthesia Preprocedure Evaluation (Signed)
Anesthesia Evaluation  Patient identified by MRN, date of birth, ID band Patient awake    Reviewed: Allergy & Precautions, NPO status , Patient's Chart, lab work & pertinent test results  Airway Mallampati: II  TM Distance: >3 FB Neck ROM: Full    Dental  (+) Teeth Intact   Pulmonary neg pulmonary ROS, former smoker,    Pulmonary exam normal breath sounds clear to auscultation       Cardiovascular Exercise Tolerance: Good negative cardio ROS Normal cardiovascular exam Rhythm:Regular Rate:Normal     Neuro/Psych negative neurological ROS  negative psych ROS   GI/Hepatic negative GI ROS, Neg liver ROS,   Endo/Other  negative endocrine ROS  Renal/GU negative Renal ROS  negative genitourinary   Musculoskeletal negative musculoskeletal ROS (+)   Abdominal Normal abdominal exam  (+)   Peds negative pediatric ROS (+)  Hematology negative hematology ROS (+)   Anesthesia Other Findings Past Medical History: 2019: Appendicitis 2011: Gastritis No date: Right lumbar radiculopathy No date: Syncope     Comment:  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  Past Surgical History: 04/27/2018: APPENDECTOMY; N/A     Comment:  Procedure: APPENDECTOMY;  Surgeon: Benjamine Sprague, DO;                Location: [facility] ORS;  Service: General;  Laterality: N/A; 05/12/2005: COLONOSCOPY No date: COLONOSCOPY     Comment:  2011, 2016, 2022 11/05/2018: FOREIGN BODY REMOVAL; Right     Comment:  Procedure: FOREIGN BODY REMOVAL ADULT - EXCISION FOREIGN              BODY RIGHT NECK WITH CT WIRE LOCALIZATION;  Surgeon:               Robert Bellow, MD;  Location: [facility] ORS;  Service:               General;  Laterality: Right; 12/20/2017: LAPAROSCOPIC APPENDECTOMY; N/A     Comment:  Procedure: APPENDECTOMY LAPAROSCOPIC;  Surgeon: Benjamine Sprague, DO;  Location: [facility] ORS;  Service: General;                Laterality: N/A; 04/27/2018: LAPAROSCOPIC  APPENDECTOMY; N/A     Comment:  Procedure: ATTEMPTED APPENDECTOMY LAPAROSCOPIC CONVERTED              TO OPEN;  Surgeon: Benjamine Sprague, DO;  Location: [facility] ORS;              Service: General;  Laterality: N/A; 05/12/2005: UPPER GI ENDOSCOPY  BMI    Body Mass Index: 32.87 kg/m      Reproductive/Obstetrics negative OB ROS                             Anesthesia Physical Anesthesia Plan  ASA: 2  Anesthesia Plan: General   Post-op Pain Management:    Induction: Intravenous  PONV Risk Score and Plan: Ondansetron, Dexamethasone, Midazolam and Treatment may vary due to age or medical condition  Airway Management Planned: Oral ETT  Additional Equipment:   Intra-op Plan:   Post-operative Plan: Extubation in OR  Informed Consent: I have reviewed the patients History and Physical, chart, labs and discussed the procedure including the risks, benefits and alternatives for the proposed anesthesia with the patient or authorized representative who has indicated his/her understanding and acceptance.     Dental Advisory Given  Plan Discussed with:  CRNA and Surgeon  Anesthesia Plan Comments:         Anesthesia Quick Evaluation

## 2021-06-26 NOTE — Transfer of Care (Signed)
Immediate Anesthesia Transfer of Care Note  Patient: Garrett Mcpherson  Procedure(s) Performed: RIGHT L4-5 MICRODISCECTOMY (Right: Spine Lumbar)  Patient Location: PACU  Anesthesia Type:General  Level of Consciousness: drowsy  Airway & Oxygen Therapy: Patient Spontanous Breathing and Patient connected to face mask oxygen  Post-op Assessment: Report given to RN and Post -op Vital signs reviewed and stable  Post vital signs: Reviewed and stable  Last Vitals:  Vitals Value Taken Time  BP 132/88 06/26/21 1230  Temp 36.3 C 06/26/21 1230  Pulse 65 06/26/21 1235  Resp 20 06/26/21 1235  SpO2 99 % 06/26/21 1235  Vitals shown include unvalidated device data.  Last Pain:  Vitals:   06/26/21 1230  TempSrc:   PainSc: Asleep         Complications: No notable events documented.

## 2021-06-26 NOTE — Discharge Instructions (Addendum)
Your surgeon has performed an operation on your lumbar spine (low back) to relieve pressure on one or more nerves. Many times, patients feel better immediately after surgery and can overdo it. Even if you feel well, it is important that you follow these activity guidelines. If you do not let your back heal properly from the surgery, you can increase the chance of a disc herniation and/or return of your symptoms. The following are instructions to help in your recovery once you have been discharged from the hospital.   Activity    No bending, lifting, or twisting (BLT). Avoid lifting objects heavier than 10 pounds (gallon milk jug).  Where possible, avoid household activities that involve lifting, bending, pushing, or pulling such as laundry, vacuuming, grocery shopping, and childcare. Try to arrange for help from friends and family for these activities while your back heals.  Increase physical activity slowly as tolerated.  Taking short walks is encouraged, but avoid strenuous exercise. Do not jog, run, bicycle, lift weights, or participate in any other exercises unless specifically allowed by your doctor. Avoid prolonged sitting, including car rides.  Talk to your doctor before resuming sexual activity.  You should not drive until cleared by your doctor.  Until released by your doctor, you should not return to work or school.  You should rest at home and let your body heal.   You may shower two days after your surgery.  After showering, lightly dab your incision dry. Do not take a tub bath or go swimming for 3 weeks, or until approved by your doctor at your follow-up appointment.  If you smoke, we strongly recommend that you quit.  Smoking has been proven to interfere with normal healing in your back and will dramatically reduce the success rate of your surgery. Please contact [facility] (800-QUIT-NOW) and use the resources at www.[facility].com for assistance in stopping smoking.  Surgical  Incision   If you have a dressing on your incision, you may remove it three days after your surgery. Keep your incision area clean and dry.  Your incision was closed with Dermabond glue. The glue should begin to peel away within about a week.  Diet            You may return to your usual diet. Be sure to stay hydrated.  When to Contact us  Although your surgery and recovery will likely be uneventful, you may have some residual numbness, aches, and pains in your back and/or legs. This is normal and should improve in the next few weeks.  However, should you experience any of the following, contact us immediately: New numbness or weakness Pain that is progressively getting worse, and is not relieved by your pain medications or rest Bleeding, redness, swelling, pain, or drainage from surgical incision Chills or flu-like symptoms Fever greater than 101.0 F (38.3 C) Problems with bowel or bladder functions Difficulty breathing or shortness of breath Warmth, tenderness, or swelling in your calf  Contact Information During office hours (Monday-Friday 9 am to 5 pm), please call your physician at [phone] After hours and weekends, please call [phone] and speak with the answering service, who will contact the doctor on call.  If that fails, call the [place] Operator at [phone] and ask for the Neurosurgery Resident On Call  For a life-threatening emergency, call [place]   The drugs that you were given will stay in your system until tomorrow so for the next 24 hours you  should not:  Drive an automobile Make any legal decisions Drink any alcoholic beverage   You may resume regular meals tomorrow.  Today it is better to start with liquids and gradually work up to solid foods.  You may eat anything you prefer, but it is better to start with liquids, then soup and crackers, and gradually work up to solid foods.   Please notify your  doctor immediately if you have any unusual bleeding, trouble breathing, redness and pain at the surgery site, drainage, fever, or pain not relieved by medication.    Information for Discharge Teaching:  DO NOT [place] for 4DAYS (96 hours) 06/30/2021 EXPAREL (bupivacaine liposome injectable suspension)  Your surgeon or anesthesiologist gave you EXPAREL(bupivacaine) to help control your pain after surgery.  EXPAREL is a local anesthetic that provides pain relief by numbing the tissue around the surgical site. EXPAREL is designed to release pain medication over time and can control pain for up to 72 hours. Depending on how you respond to EXPAREL, you may require less pain medication during your recovery. Possible side effects: Temporary loss of sensation or ability to move in the area where bupivacaine was injected. Nausea, vomiting, constipation Rarely, numbness and tingling in your mouth or lips, lightheadedness, or anxiety may occur. Call your doctor right away if you think you may be experiencing any of these sensations, or if you have other questions regarding possible side effects.  Follow all other discharge instructions given to you by your surgeon or nurse. Eat a healthy diet and drink plenty of water or other fluids.  If you return to the hospital for any reason within 96 hours following the administration of EXPAREL, it is important for health care providers to know that you have received this anesthetic. A teal colored band has been placed on your arm with the date, time and amount of EXPAREL you have received in order to alert and inform your health care providers. Please leave this armband in place for the full 96 hours following administration, and then you may remove the band.      Please contact your physician with any problems or Same Day Surgery at [phone], Monday through Friday 6 am to 4 pm, or [place] at [facility] number at [phone].

## 2021-06-26 NOTE — Anesthesia Postprocedure Evaluation (Signed)
Anesthesia Post Note  Patient: Garrett Mcpherson  Procedure(s) Performed: RIGHT L4-5 MICRODISCECTOMY (Right: Spine Lumbar)  Anesthesia Type: General Anesthetic complications: no   No notable events documented.   Last Vitals:  Vitals:   06/26/21 1239 06/26/21 1245  BP:  (!) 147/93  Pulse: 77 72  Resp: 14 19  Temp:    SpO2: 100% 99%    Last Pain:  Vitals:   06/26/21 1245  TempSrc:   PainSc: 0-No pain                 VAN STAVEREN,Larsen Zettel

## 2021-06-26 NOTE — Discharge Summary (Signed)
Physician Discharge Summary  Patient ID: Garrett Mcpherson MRN: [id number] DOB/AGE: 12-24-1980 41 y.o.  Admit date: 06/26/2021 Discharge date: 06/26/2021  Admission Diagnoses: lumbar radiculopathy and weakness  Discharge Diagnoses:  Active Problems:   * No active hospital problems. *   Discharged Condition: good  Hospital Course:  Garrett Mcpherson is a 41 y.o s/p right L4-5 microdiscectomy. His interoperative course was uncomplicated. He was monitored post-op and discharged home after ambulating, urinating, and tolerating PO intake. He was given prescriptions for Percocet, Robaxin, and Senna.  Consults: None  Significant Diagnostic Studies: none  Treatments: surgery: as above. Please see separately dictated operative report for further details.  Discharge Exam: Blood pressure (!) 121/100, pulse 81, temperature (!) 97.3 F (36.3 C), temperature source Temporal, resp. rate 14, height 6\' 2"  (1.88 m), weight 116.1 kg, SpO2 100 %. CN II-XII grossly intact 5/5 throughout BLE except 4/5 right EHL and DF  Disposition: Discharge disposition: 01-Home or Self Care        Allergies as of 06/26/2021   No Known Allergies      Medication List     TAKE these medications    meloxicam 15 MG tablet Commonly known as: MOBIC Take 1 tablet (15 mg total) by mouth daily. What changed:  when to take this reasons to take this   methocarbamol 500 MG tablet Commonly known as: Robaxin Take 1 tablet (500 mg total) by mouth 4 (four) times daily.   oxyCODONE-acetaminophen 5-325 MG tablet Commonly known as: Percocet Take 1 tablet by mouth every 4 (four) hours as needed for up to 5 days for severe pain.   predniSONE 10 MG (21) Tbpk tablet Commonly known as: STERAPRED UNI-PAK 21 TAB Take 6 tablets the first day, take 5 tablets the second day, take 4 tablets the third day, take 3 tablets the fourth day, take 2 tablets the fifth day, take 1 tablet the sixth day.   senna 8.6 MG Tabs  tablet Commonly known as: SENOKOT Take 1 tablet (8.6 mg total) by mouth daily as needed for mild constipation.        Follow-up Information     06/28/2021, PA Follow up in 2 week(s).   Why: for post-op and incision check Contact information: [address] [place] [phone]                 Signed: [phone] 06/26/2021, 12:15 PM

## 2021-06-26 NOTE — Progress Notes (Signed)
Pharmacy Antibiotic Note  Garrett Mcpherson is a 41 y.o. male admitted on (Not on file) with surgical prophylaxis.  Pharmacy has been consulted for Cefazolin dosing.  Plan: TBW = 116.1 kg   Cefazolin 2 gm IV X 1 60 min pre-op ordered for 2/15 @ 0500.   Height: 6\' 2"  (188 cm) Weight: 116.1 kg (256 lb) IBW/kg (Calculated) : 82.2  No data recorded.  Recent Labs  Lab 06/25/21 1147  WBC 15.6*  CREATININE 0.89    Estimated Creatinine Clearance: 148 mL/min (by C-G formula based on SCr of 0.89 mg/dL).    No Known Allergies  Antimicrobials this admission:   >>    >>   Dose adjustments this admission:   Microbiology results:  BCx:   UCx:    Sputum:    MRSA PCR:   Thank you for allowing pharmacy to be a part of this patients care.  Mariko Nowakowski D 06/26/2021 1:51 AM

## 2021-06-26 NOTE — Op Note (Signed)
Indications: Mr. Bosarge presented to clinic with worsening symptoms of:  M54.16 lumbar radiculopathy, R29.898 right leg weakness, M21.371 right foot drop  He had worsening findings including weakness prompting surgical intervention.  Findings: large disc herniation L4-5  Preoperative Diagnosis: M54.16 lumbar radiculopathy, R29.898 right leg weakness, M21.371 right foot drop Postoperative Diagnosis: same   EBL: 25 ml IVF: 1000 ml Drains: none Disposition: Extubated and Stable to PACU Complications: none  No foley catheter was placed.   Preoperative Note:   Risks of surgery discussed include: infection, bleeding, stroke, coma, death, paralysis, CSF leak, nerve/spinal cord injury, numbness, tingling, weakness, complex regional pain syndrome, recurrent stenosis and/or disc herniation, vascular injury, development of instability, neck/back pain, need for further surgery, persistent symptoms, development of deformity, and the risks of anesthesia. The patient understood these risks and agreed to proceed.  Operative Note:   1) Right L4/5 microdiscectomy  The patient was then brought from the preoperative center with intravenous access established.  The patient underwent general anesthesia and endotracheal tube intubation, and was then rotated on the [place] rail top where all pressure points were appropriately padded.  The skin was then thoroughly cleansed.  Perioperative antibiotic prophylaxis was administered.  Sterile prep and drapes were then applied and a timeout was then observed.  C-arm was brought into the field under sterile conditions, and the L4-5 disc space identified and marked with an incision on the right 1cm lateral to midline.  Once this was complete a 2 cm incision was opened with the use of a #10 blade knife.  The Metrx tubes were sequentially advanced under lateral fluoroscopy until a 18 x 60 mm Metrx tube was placed over the facet and lamina and secured to the bed.     The microscope was then sterilely brought into the field and muscle creep was hemostased with a bipolar and resected with a pituitary rongeur.  A Bovie extender was then used to expose the spinous process and lamina.  Careful attention was placed to not violate the facet capsule. A 3 mm matchstick drill bit was then used to make a hemi-laminotomy trough until the ligamentum flavum was exposed.  This was extended to the base of the spinous process.  Once this was complete and the underlying ligamentum flavum was visualized this was dissected with an up angle curette and resected with a #2 and #3 mm biting Kerrison.  The laminotomy opening was also expanded in similar fashion and hemostasis was obtained with Surgifoam and a patty as well as bone wax.  The rostral aspect of the caudal level of the lamina was also resected with a #2 biting Kerrison effort to further enhance exposure.  Once the underlying dura was visualized a Penfield 4 was then used to dissect and expose the traversing nerve root.  Once this was identified a nerve root retractor suction was used to mobilize this medially.  The venous plexus was hemostased with Surgifoam and light bipolar use.  A small penfied was then used to make a small annulotomy within the disc space and disc space contents were noted to come through the annulus.    The disc herniation was identified and dissected free using a balltip probe. The pituitary rongeur was used to remove the extruded disc fragments. Once the thecal sac and nerve root were noted to be relaxed and under less tension the ball-tipped feeler was passed along the foramen distally to to ensure no residual compression was noted.    Depo-Medrol was placed along the  nerve root.  The area was irrigated. The tube system was then removed under microscopic visualization and hemostasis was obtained with a bipolar.    The fascial layer was reapproximated with the use of a 0- Vicryl suture.  Subcutaneous tissue  layer was reapproximated using 2-0 Vicryl suture.  3-0 monocryl was used on the skin. The skin was then cleansed and Dermabond was used to close the skin opening.  Patient was then rotated back to the preoperative bed awakened from anesthesia and taken to recovery all counts are correct in this case.   I performed the entire procedure with the assistance of Cooper Render PA as an Pensions consultant.  Meade Maw MD

## 2021-06-27 ENCOUNTER — Encounter: Payer: Self-pay | Admitting: Neurosurgery

## 2021-07-23 DIAGNOSIS — Z9889 Other specified postprocedural states: Secondary | ICD-10-CM | POA: Diagnosis not present

## 2021-08-06 DIAGNOSIS — Z9889 Other specified postprocedural states: Secondary | ICD-10-CM | POA: Diagnosis not present

## 2022-01-07 DIAGNOSIS — Z85828 Personal history of other malignant neoplasm of skin: Secondary | ICD-10-CM | POA: Diagnosis not present

## 2022-01-07 DIAGNOSIS — Q825 Congenital non-neoplastic nevus: Secondary | ICD-10-CM | POA: Diagnosis not present

## 2022-01-07 DIAGNOSIS — D225 Melanocytic nevi of trunk: Secondary | ICD-10-CM | POA: Diagnosis not present

## 2022-01-07 DIAGNOSIS — L821 Other seborrheic keratosis: Secondary | ICD-10-CM | POA: Diagnosis not present

## 2022-01-07 DIAGNOSIS — L570 Actinic keratosis: Secondary | ICD-10-CM | POA: Diagnosis not present

## 2022-01-07 DIAGNOSIS — L578 Other skin changes due to chronic exposure to nonionizing radiation: Secondary | ICD-10-CM | POA: Diagnosis not present

## 2022-01-07 DIAGNOSIS — L700 Acne vulgaris: Secondary | ICD-10-CM | POA: Diagnosis not present

## 2022-04-10 DIAGNOSIS — M542 Cervicalgia: Secondary | ICD-10-CM | POA: Diagnosis not present

## 2022-07-06 DIAGNOSIS — R059 Cough, unspecified: Secondary | ICD-10-CM | POA: Diagnosis not present

## 2022-07-06 DIAGNOSIS — R0602 Shortness of breath: Secondary | ICD-10-CM | POA: Diagnosis not present

## 2022-07-06 DIAGNOSIS — R079 Chest pain, unspecified: Secondary | ICD-10-CM | POA: Diagnosis not present

## 2022-07-06 DIAGNOSIS — R42 Dizziness and giddiness: Secondary | ICD-10-CM | POA: Diagnosis not present

## 2022-07-06 DIAGNOSIS — R0989 Other specified symptoms and signs involving the circulatory and respiratory systems: Secondary | ICD-10-CM | POA: Diagnosis not present

## 2022-07-06 DIAGNOSIS — R0981 Nasal congestion: Secondary | ICD-10-CM | POA: Diagnosis not present

## 2022-07-06 DIAGNOSIS — R110 Nausea: Secondary | ICD-10-CM | POA: Diagnosis not present

## 2022-07-06 DIAGNOSIS — R0789 Other chest pain: Secondary | ICD-10-CM | POA: Diagnosis not present

## 2022-09-11 DIAGNOSIS — E291 Testicular hypofunction: Secondary | ICD-10-CM | POA: Diagnosis not present

## 2022-09-11 DIAGNOSIS — R0683 Snoring: Secondary | ICD-10-CM | POA: Diagnosis not present

## 2022-09-11 DIAGNOSIS — Z0000 Encounter for general adult medical examination without abnormal findings: Secondary | ICD-10-CM | POA: Diagnosis not present

## 2022-09-11 DIAGNOSIS — R030 Elevated blood-pressure reading, without diagnosis of hypertension: Secondary | ICD-10-CM | POA: Diagnosis not present

## 2022-09-11 DIAGNOSIS — F5101 Primary insomnia: Secondary | ICD-10-CM | POA: Diagnosis not present

## 2022-09-12 DIAGNOSIS — Z13220 Encounter for screening for lipoid disorders: Secondary | ICD-10-CM | POA: Diagnosis not present

## 2022-09-12 DIAGNOSIS — E291 Testicular hypofunction: Secondary | ICD-10-CM | POA: Diagnosis not present

## 2022-09-12 DIAGNOSIS — Z125 Encounter for screening for malignant neoplasm of prostate: Secondary | ICD-10-CM | POA: Diagnosis not present

## 2022-09-12 DIAGNOSIS — Z0000 Encounter for general adult medical examination without abnormal findings: Secondary | ICD-10-CM | POA: Diagnosis not present

## 2022-10-06 DIAGNOSIS — M62830 Muscle spasm of back: Secondary | ICD-10-CM | POA: Diagnosis not present

## 2022-10-06 DIAGNOSIS — M9903 Segmental and somatic dysfunction of lumbar region: Secondary | ICD-10-CM | POA: Diagnosis not present

## 2022-10-06 DIAGNOSIS — Z72820 Sleep deprivation: Secondary | ICD-10-CM | POA: Diagnosis not present

## 2022-10-06 DIAGNOSIS — M5431 Sciatica, right side: Secondary | ICD-10-CM | POA: Diagnosis not present

## 2022-10-17 DIAGNOSIS — M9903 Segmental and somatic dysfunction of lumbar region: Secondary | ICD-10-CM | POA: Diagnosis not present

## 2022-10-17 DIAGNOSIS — M62830 Muscle spasm of back: Secondary | ICD-10-CM | POA: Diagnosis not present

## 2022-10-17 DIAGNOSIS — Z72820 Sleep deprivation: Secondary | ICD-10-CM | POA: Diagnosis not present

## 2022-10-17 DIAGNOSIS — M5431 Sciatica, right side: Secondary | ICD-10-CM | POA: Diagnosis not present

## 2022-10-24 DIAGNOSIS — M62830 Muscle spasm of back: Secondary | ICD-10-CM | POA: Diagnosis not present

## 2022-10-24 DIAGNOSIS — M5431 Sciatica, right side: Secondary | ICD-10-CM | POA: Diagnosis not present

## 2022-10-24 DIAGNOSIS — Z72820 Sleep deprivation: Secondary | ICD-10-CM | POA: Diagnosis not present

## 2022-10-24 DIAGNOSIS — M9903 Segmental and somatic dysfunction of lumbar region: Secondary | ICD-10-CM | POA: Diagnosis not present

## 2022-11-07 DIAGNOSIS — M9903 Segmental and somatic dysfunction of lumbar region: Secondary | ICD-10-CM | POA: Diagnosis not present

## 2022-11-07 DIAGNOSIS — M62830 Muscle spasm of back: Secondary | ICD-10-CM | POA: Diagnosis not present

## 2022-11-07 DIAGNOSIS — Z72820 Sleep deprivation: Secondary | ICD-10-CM | POA: Diagnosis not present

## 2022-11-07 DIAGNOSIS — M5431 Sciatica, right side: Secondary | ICD-10-CM | POA: Diagnosis not present

## 2022-12-03 DIAGNOSIS — N3000 Acute cystitis without hematuria: Secondary | ICD-10-CM | POA: Diagnosis not present

## 2022-12-14 DIAGNOSIS — N419 Inflammatory disease of prostate, unspecified: Secondary | ICD-10-CM | POA: Diagnosis not present

## 2023-01-09 DIAGNOSIS — L700 Acne vulgaris: Secondary | ICD-10-CM | POA: Diagnosis not present

## 2023-01-09 DIAGNOSIS — L570 Actinic keratosis: Secondary | ICD-10-CM | POA: Diagnosis not present

## 2023-08-12 DIAGNOSIS — D485 Neoplasm of uncertain behavior of skin: Secondary | ICD-10-CM | POA: Diagnosis not present

## 2023-08-12 DIAGNOSIS — D170 Benign lipomatous neoplasm of skin and subcutaneous tissue of head, face and neck: Secondary | ICD-10-CM | POA: Diagnosis not present

## 2023-08-12 DIAGNOSIS — Z872 Personal history of diseases of the skin and subcutaneous tissue: Secondary | ICD-10-CM | POA: Diagnosis not present

## 2023-08-12 DIAGNOSIS — D225 Melanocytic nevi of trunk: Secondary | ICD-10-CM | POA: Diagnosis not present

## 2023-08-12 DIAGNOSIS — L700 Acne vulgaris: Secondary | ICD-10-CM | POA: Diagnosis not present

## 2023-08-12 DIAGNOSIS — L578 Other skin changes due to chronic exposure to nonionizing radiation: Secondary | ICD-10-CM | POA: Diagnosis not present

## 2023-08-12 DIAGNOSIS — D229 Melanocytic nevi, unspecified: Secondary | ICD-10-CM | POA: Diagnosis not present

## 2023-08-12 DIAGNOSIS — Z85828 Personal history of other malignant neoplasm of skin: Secondary | ICD-10-CM | POA: Diagnosis not present

## 2023-09-10 ENCOUNTER — Encounter: Payer: Self-pay | Admitting: General Surgery

## 2023-09-10 ENCOUNTER — Ambulatory Visit: Payer: Self-pay | Admitting: General Surgery

## 2023-09-10 VITALS — BP 143/84 | HR 85 | Ht 74.0 in | Wt 256.0 lb

## 2023-09-10 DIAGNOSIS — R220 Localized swelling, mass and lump, head: Secondary | ICD-10-CM

## 2023-09-10 NOTE — Patient Instructions (Signed)
 Call us  to be seen if you notice any changes or enlargement.   Lipoma  A lipoma is a noncancerous (benign) tumor that is made up of fat cells. This is a very common type of soft-tissue growth. Lipomas are usually found under the skin (subcutaneous). They may occur in any tissue of the body that contains fat. Common areas for lipomas to appear include the back, arms, shoulders, buttocks, and thighs. Lipomas grow slowly, and they are usually painless. Most lipomas do not cause problems and do not require treatment. What are the causes? The cause of this condition is not known. What increases the risk? You are more likely to develop this condition if: You are 54-53 years old. You have a family history of lipomas. What are the signs or symptoms? A lipoma usually appears as a small, round bump under the skin. In most cases, the lump will: Feel soft or rubbery. Not cause pain or other symptoms. However, if a lipoma is located in an area where it pushes on nerves, it can become painful or cause other symptoms. How is this diagnosed? A lipoma can usually be diagnosed with a physical exam. You may also have tests to confirm the diagnosis and to rule out other conditions. Tests may include: Imaging tests, such as a CT scan or an MRI. Removal of a tissue sample to be looked at under a microscope (biopsy). How is this treated? Treatment for this condition depends on the size of the lipoma and whether it is causing any symptoms. For small lipomas that are not causing problems, no treatment is needed. If a lipoma is bigger or it causes problems, surgery may be done to remove the lipoma. Lipomas can also be removed to improve appearance. Most often, the procedure is done after applying a medicine that numbs the area (local anesthetic). Liposuction may be done to reduce the size of the lipoma before it is removed through surgery, or it may be done to remove the lipoma. Lipomas are removed with this method  to limit incision size and scarring. A liposuction tube is inserted through a small incision into the lipoma, and the contents of the lipoma are removed through the tube with suction. Follow these instructions at home: Watch your lipoma for any changes. Keep all follow-up visits. This is important. Where to find more information OrthoInfo: orthoinfo.aaos.org Contact a health care provider if: Your lipoma becomes larger or hard. Your lipoma becomes painful, red, or increasingly swollen. These could be signs of infection or a more serious condition. Get help right away if: You develop tingling or numbness in an area near the lipoma. This could indicate that the lipoma is causing nerve damage. Summary A lipoma is a noncancerous tumor that is made up of fat cells. Most lipomas do not cause problems and do not require treatment. If a lipoma is bigger or it causes problems, surgery may be done to remove the lipoma. Contact a health care provider if your lipoma becomes larger or hard, or if it becomes painful, red, or increasingly swollen. These could be signs of infection or a more serious condition. This information is not intended to replace advice given to you by your health care provider. Make sure you discuss any questions you have with your health care provider. Document Revised: 05/17/2021 Document Reviewed: 05/17/2021 Elsevier Patient Education  2024 [employer].

## 2023-09-11 NOTE — Progress Notes (Signed)
 Patient ID: Garrett Mcpherson, male   DOB: 1980/05/23, 43 y.o.   MRN: [id number] CC: Mass of Scalp History of Present Illness Garrett Mcpherson is a 43 y.o. male with past medical history as listed below who presents in consultation for left scalp growth.  The patient reports that he has noticed this growth last several months.  He says he does not have any pain over the.  There is no overlying skin changes.  He does not think it is grown significantly since he first noticed it.  He denies any trauma to the area.  He denies any drainage from the area.  He was referred by his primary care provider for this.  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       Past Surgical History:  Procedure Laterality Date   APPENDECTOMY N/A 04/27/2018   Procedure: APPENDECTOMY;  Surgeon: Conrado Delay, DO;  Location: [facility];  Service: General;  Laterality: N/A;   COLONOSCOPY  05/12/2005   COLONOSCOPY     2011, 2016, 2022   FOREIGN BODY REMOVAL Right 11/05/2018   Procedure: FOREIGN BODY REMOVAL ADULT - EXCISION FOREIGN BODY RIGHT NECK WITH CT WIRE LOCALIZATION;  Surgeon: Marshall Skeeter, MD;  Location: [facility];  Service: General;  Laterality: Right;   LAPAROSCOPIC APPENDECTOMY N/A 12/20/2017   Procedure: APPENDECTOMY LAPAROSCOPIC;  Surgeon: Conrado Delay, DO;  Location: [facility];  Service: General;  Laterality: N/A;   LAPAROSCOPIC APPENDECTOMY N/A 04/27/2018   Procedure: ATTEMPTED APPENDECTOMY LAPAROSCOPIC CONVERTED TO OPEN;  Surgeon: Conrado Delay, DO;  Location: [facility];  Service: General;  Laterality: N/A;   LUMBAR LAMINECTOMY/DECOMPRESSION MICRODISCECTOMY Right 06/26/2021   Procedure: RIGHT L4-5 MICRODISCECTOMY;  Surgeon: Jodeen Munch, MD;  Location: [facility];  Service: Neurosurgery;  Laterality: Right;   UPPER GI ENDOSCOPY  05/12/2005    No Known  Allergies  Current Outpatient Medications  Medication Sig Dispense Refill   Multiple Vitamin (MULTIVITAMIN ADULT PO) Take by mouth.     No current facility-administered medications for this visit.    Family History Family History  Problem Relation Age of Onset   Cancer Mother        breast        Social History Social History   Tobacco Use   Smoking status: Former    Current packs/day: 0.00    Types: Cigarettes    Start date: 09/13/1993    Quit date: 09/14/2003    Years since quitting: 20.0    Passive exposure: Past   Smokeless tobacco: Never  Vaping Use   Vaping status: Never Used  Substance Use Topics   Alcohol use: Yes    Alcohol/week: 0.0 standard drinks of alcohol    Comment: very rarely   Drug use: No        ROS Full ROS of systems performed and is otherwise negative there than what is stated in the HPI  Physical Exam Blood pressure (!) 143/84, pulse 85, height 6\' 2"  (1.88 m), weight 256 lb (116.1 kg), SpO2 95%.  Alert and oriented x 3, normal work of breathing on room air, regular rate and rhythm, abdomen soft, nontender nondistended with surgical scars well-healed.  On his left scalp there is a very small area of prominence.  This is flat and not lobulated.  It is soft to touch and mobile.  There is no  overlying skin changes and no drainage from the area.  Data Reviewed I reviewed his last labs and they are significant for a normal kidney function.  I have personally reviewed the patient's imaging and medical records.    Assessment    Patient with a small soft tissue growth on his left scalp.  Plan    I discussed with the patient that this is likely a benign etiology given that it is not grown significantly over the last several months.  I discussed with him that we could try to resected but oftentimes this is just subcutaneous fat prominence and a fat pad.  It does not feel like a lipoma as it is not lobulated and feels more flat.  However, if he is  adamant about me exploring the area I agreed to.  Due to the fact that I think it is a benign lesion he is elected not to pursue resection of it.  I discussed warning signs including any overlying skin changes or large growth in it over a short period of time.  If those happen we will plan to see him back otherwise he can follow-up with us  on a as needed basis.   A total of 45 minutes was spent reviewing the patient's chart, performing history and physical and discussing treatment options with the patient    [name] 09/11/2023, 12:08 PM

## 2023-11-27 DIAGNOSIS — R197 Diarrhea, unspecified: Secondary | ICD-10-CM | POA: Diagnosis not present

## 2023-11-27 DIAGNOSIS — R110 Nausea: Secondary | ICD-10-CM | POA: Diagnosis not present

## 2023-11-27 DIAGNOSIS — K219 Gastro-esophageal reflux disease without esophagitis: Secondary | ICD-10-CM | POA: Diagnosis not present

## 2023-12-01 ENCOUNTER — Other Ambulatory Visit: Payer: Self-pay | Admitting: Family Medicine

## 2023-12-01 DIAGNOSIS — R1013 Epigastric pain: Secondary | ICD-10-CM

## 2023-12-18 DIAGNOSIS — R1013 Epigastric pain: Secondary | ICD-10-CM | POA: Diagnosis not present

## 2023-12-18 DIAGNOSIS — R112 Nausea with vomiting, unspecified: Secondary | ICD-10-CM | POA: Diagnosis not present

## 2024-01-04 ENCOUNTER — Other Ambulatory Visit: Payer: Self-pay | Admitting: Family Medicine

## 2024-01-04 DIAGNOSIS — K219 Gastro-esophageal reflux disease without esophagitis: Secondary | ICD-10-CM

## 2024-01-04 DIAGNOSIS — R110 Nausea: Secondary | ICD-10-CM

## 2024-01-04 DIAGNOSIS — R197 Diarrhea, unspecified: Secondary | ICD-10-CM

## 2024-01-04 DIAGNOSIS — R1013 Epigastric pain: Secondary | ICD-10-CM

## 2024-01-20 ENCOUNTER — Ambulatory Visit
Admission: RE | Admit: 2024-01-20 | Discharge: 2024-01-20 | Disposition: A | Discharge status: Home / Self Care | Source: Ambulatory Visit | Attending: Family Medicine | Admitting: Family Medicine

## 2024-01-20 DIAGNOSIS — K219 Gastro-esophageal reflux disease without esophagitis: Secondary | ICD-10-CM | POA: Diagnosis not present

## 2024-01-20 DIAGNOSIS — R1013 Epigastric pain: Secondary | ICD-10-CM | POA: Diagnosis not present

## 2024-01-20 DIAGNOSIS — R110 Nausea: Secondary | ICD-10-CM | POA: Insufficient documentation

## 2024-01-20 DIAGNOSIS — R109 Unspecified abdominal pain: Secondary | ICD-10-CM | POA: Diagnosis not present

## 2024-01-20 DIAGNOSIS — R197 Diarrhea, unspecified: Secondary | ICD-10-CM | POA: Insufficient documentation

## 2024-01-20 MED ORDER — TECHNETIUM TC 99M MEBROFENIN IV KIT
5.2500 | PACK | Freq: Once | INTRAVENOUS | Status: AC | PRN
  Administered 2024-01-20: 5.25 via INTRAVENOUS

## 2024-03-14 DIAGNOSIS — R590 Localized enlarged lymph nodes: Secondary | ICD-10-CM | POA: Diagnosis not present

## 2024-03-14 DIAGNOSIS — Z2821 Immunization not carried out because of patient refusal: Secondary | ICD-10-CM | POA: Diagnosis not present
